# Patient Record
Sex: Female | Born: 1990 | Race: White | Hispanic: No | Marital: Single | Smoking: Former smoker
Health system: Southern US, Community
[De-identification: ages and names within clinical notes are randomized; demographics above are authoritative.]

## PROBLEM LIST (undated history)

## (undated) DIAGNOSIS — J45909 Unspecified asthma, uncomplicated: Secondary | ICD-10-CM

## (undated) DIAGNOSIS — G40909 Epilepsy, unspecified, not intractable, without status epilepticus: Secondary | ICD-10-CM

---
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
## 2011-02-04 ENCOUNTER — Observation Stay: Payer: Self-pay

## 2011-02-04 LAB — URINALYSIS, COMPLETE
Bilirubin,UR: NEGATIVE
Ketone: NEGATIVE
Ph: 6 (ref 4.5–8.0)
Squamous Epithelial: 2

## 2011-03-02 ENCOUNTER — Observation Stay: Payer: Self-pay | Admitting: Obstetrics and Gynecology

## 2011-03-09 ENCOUNTER — Inpatient Hospital Stay: Payer: Self-pay | Admitting: Obstetrics and Gynecology

## 2011-03-09 LAB — CBC WITH DIFFERENTIAL/PLATELET
Basophil %: 0.2 %
Eosinophil #: 0.1 10*3/uL (ref 0.0–0.7)
HCT: 30.6 % — ABNORMAL LOW (ref 35.0–47.0)
HGB: 10.6 g/dL — ABNORMAL LOW (ref 12.0–16.0)
MCH: 32.5 pg (ref 26.0–34.0)
MCHC: 34.6 g/dL (ref 32.0–36.0)
MCV: 94 fL (ref 80–100)
Monocyte #: 0.7 10*3/uL (ref 0.0–0.7)
Neutrophil #: 10.7 10*3/uL — ABNORMAL HIGH (ref 1.4–6.5)
Neutrophil %: 79.6 %
RBC: 3.26 10*6/uL — ABNORMAL LOW (ref 3.80–5.20)

## 2011-03-21 LAB — COMPREHENSIVE METABOLIC PANEL
Alkaline Phosphatase: 119 U/L (ref 82–169)
Anion Gap: 13 (ref 7–16)
Bilirubin,Total: 0.4 mg/dL (ref 0.2–1.0)
Chloride: 106 mmol/L (ref 98–107)
Co2: 22 mmol/L (ref 21–32)
Creatinine: 0.64 mg/dL (ref 0.60–1.30)
EGFR (African American): >60
EGFR (Non-African Amer.): >60
Potassium: 3.4 mmol/L — ABNORMAL LOW (ref 3.5–5.1)
SGPT (ALT): 12 U/L
Sodium: 141 mmol/L (ref 136–145)
Total Protein: 7.8 g/dL (ref 6.4–8.6)

## 2011-03-21 LAB — CBC
MCH: 32.3 pg (ref 26.0–34.0)
MCHC: 34.6 g/dL (ref 32.0–36.0)
MCV: 93 fL (ref 80–100)
Platelet: 352 10*3/uL (ref 150–440)
RDW: 13 % (ref 11.5–14.5)

## 2011-03-21 LAB — LIPASE, BLOOD: Lipase: 74 U/L (ref 73–393)

## 2011-03-22 ENCOUNTER — Inpatient Hospital Stay: Payer: Self-pay | Admitting: Internal Medicine

## 2011-03-22 LAB — URINALYSIS, COMPLETE
Bilirubin,UR: NEGATIVE
Glucose,UR: NEGATIVE mg/dL (ref 0–75)
Ph: 6 (ref 4.5–8.0)
Specific Gravity: 1.018 (ref 1.003–1.030)
Squamous Epithelial: 1
WBC UR: 285 /HPF (ref 0–5)

## 2011-03-22 LAB — DIFFERENTIAL
Bands: 6 %
Basophil #: 0 10*3/uL (ref 0.0–0.1)
Comment - H1-Com2: NORMAL
Lymphocyte #: 1 10*3/uL (ref 1.0–3.6)
Lymphocyte %: 3.8 %
Monocyte #: 2.1 10*3/uL — ABNORMAL HIGH (ref 0.0–0.7)
Monocytes: 7 %
Neutrophil %: 88.2 %

## 2011-03-23 LAB — WBC: WBC: 16.1 10*3/uL — ABNORMAL HIGH (ref 3.6–11.0)

## 2011-03-24 LAB — URINE CULTURE

## 2011-03-27 LAB — CULTURE, BLOOD (SINGLE)

## 2011-11-12 ENCOUNTER — Emergency Department: Payer: Self-pay | Admitting: Emergency Medicine

## 2012-02-10 ENCOUNTER — Emergency Department: Payer: Self-pay | Admitting: Emergency Medicine

## 2012-02-10 LAB — COMPREHENSIVE METABOLIC PANEL
Alkaline Phosphatase: 89 U/L (ref 50–136)
BUN: 10 mg/dL (ref 7–18)
Co2: 23 mmol/L (ref 21–32)
EGFR (African American): >60
EGFR (Non-African Amer.): >60
Glucose: 83 mg/dL (ref 65–99)
Potassium: 3.7 mmol/L (ref 3.5–5.1)
SGOT(AST): 17 U/L (ref 15–37)
Sodium: 141 mmol/L (ref 136–145)

## 2012-02-10 LAB — URINALYSIS, COMPLETE
Bilirubin,UR: NEGATIVE
Glucose,UR: NEGATIVE mg/dL (ref 0–75)
Ketone: NEGATIVE
Ph: 7 (ref 4.5–8.0)
RBC,UR: 4 /HPF (ref 0–5)
Specific Gravity: 1.024 (ref 1.003–1.030)
Squamous Epithelial: 7
WBC UR: 8 /HPF (ref 0–5)

## 2012-02-10 LAB — CBC
HGB: 15 g/dL (ref 12.0–16.0)
Platelet: 231 10*3/uL (ref 150–440)

## 2012-02-11 LAB — TSH: Thyroid Stimulating Horm: 1.38 u[IU]/mL

## 2012-02-11 LAB — CK TOTAL AND CKMB (NOT AT ARMC)
CK, Total: 52 U/L (ref 21–215)
CK-MB: <0.5 ng/mL — ABNORMAL LOW (ref 0.5–3.6)

## 2012-03-21 ENCOUNTER — Emergency Department: Payer: Self-pay | Admitting: Emergency Medicine

## 2012-03-21 LAB — CBC
HCT: 42.4 % (ref 35.0–47.0)
HGB: 14.6 g/dL (ref 12.0–16.0)
Platelet: 226 10*3/uL (ref 150–440)
RDW: 12.9 % (ref 11.5–14.5)
WBC: 12.1 10*3/uL — ABNORMAL HIGH (ref 3.6–11.0)

## 2012-03-21 LAB — URINALYSIS, COMPLETE
Bilirubin,UR: NEGATIVE
Ketone: NEGATIVE
Nitrite: NEGATIVE
Protein: NEGATIVE
Specific Gravity: 1.023 (ref 1.003–1.030)
Squamous Epithelial: 3
WBC UR: 1 /HPF (ref 0–5)

## 2012-03-21 LAB — HCG, QUANTITATIVE, PREGNANCY: Beta Hcg, Quant.: <1 m[IU]/mL — ABNORMAL LOW

## 2012-03-24 ENCOUNTER — Emergency Department: Payer: Self-pay | Admitting: Unknown Physician Specialty

## 2012-03-24 LAB — COMPREHENSIVE METABOLIC PANEL
Anion Gap: 6 — ABNORMAL LOW (ref 7–16)
Calcium, Total: 8.9 mg/dL (ref 8.5–10.1)
Creatinine: 0.59 mg/dL — ABNORMAL LOW (ref 0.60–1.30)
Glucose: 92 mg/dL (ref 65–99)
Osmolality: 273 (ref 275–301)
Potassium: 3.8 mmol/L (ref 3.5–5.1)
SGPT (ALT): 14 U/L (ref 12–78)
Sodium: 136 mmol/L (ref 136–145)
Total Protein: 8.3 g/dL — ABNORMAL HIGH (ref 6.4–8.2)

## 2012-03-24 LAB — URINALYSIS, COMPLETE
Bilirubin,UR: NEGATIVE
Glucose,UR: NEGATIVE mg/dL (ref 0–75)
Nitrite: NEGATIVE
Ph: 5 (ref 4.5–8.0)
Protein: 30
WBC UR: 2 /HPF (ref 0–5)

## 2012-03-24 LAB — CBC
HGB: 14.2 g/dL (ref 12.0–16.0)
MCHC: 33.9 g/dL (ref 32.0–36.0)
MCV: 94 fL (ref 80–100)
RBC: 4.46 10*6/uL (ref 3.80–5.20)
RDW: 13.2 % (ref 11.5–14.5)
WBC: 9.2 10*3/uL (ref 3.6–11.0)

## 2012-11-14 ENCOUNTER — Emergency Department: Payer: Self-pay | Admitting: Emergency Medicine

## 2012-11-14 LAB — COMPREHENSIVE METABOLIC PANEL
Alkaline Phosphatase: 87 U/L (ref 50–136)
Bilirubin,Total: 0.6 mg/dL (ref 0.2–1.0)
Chloride: 107 mmol/L (ref 98–107)
Creatinine: 0.65 mg/dL (ref 0.60–1.30)
Glucose: 104 mg/dL — ABNORMAL HIGH (ref 65–99)
Potassium: 4.5 mmol/L (ref 3.5–5.1)
SGPT (ALT): 16 U/L (ref 12–78)
Sodium: 137 mmol/L (ref 136–145)

## 2012-11-14 LAB — URINALYSIS, COMPLETE
Glucose,UR: NEGATIVE mg/dL (ref 0–75)
Ketone: NEGATIVE
Ph: 5 (ref 4.5–8.0)
Specific Gravity: 1.027 (ref 1.003–1.030)

## 2012-11-14 LAB — CBC
MCV: 94 fL (ref 80–100)
Platelet: 233 10*3/uL (ref 150–440)
RBC: 4.4 10*6/uL (ref 3.80–5.20)
WBC: 9.6 10*3/uL (ref 3.6–11.0)

## 2012-11-14 LAB — LIPASE, BLOOD: Lipase: 154 U/L (ref 73–393)

## 2012-11-24 ENCOUNTER — Ambulatory Visit: Payer: Self-pay | Admitting: Gastroenterology

## 2012-12-12 ENCOUNTER — Ambulatory Visit: Payer: Self-pay | Admitting: Gastroenterology

## 2012-12-14 LAB — PATHOLOGY REPORT

## 2013-01-11 ENCOUNTER — Emergency Department: Payer: Self-pay | Admitting: Emergency Medicine

## 2013-01-11 LAB — RAPID INFLUENZA A&B ANTIGENS

## 2013-03-09 ENCOUNTER — Emergency Department: Payer: Self-pay | Admitting: Emergency Medicine

## 2013-03-09 LAB — CBC
HCT: 42.2 % (ref 35.0–47.0)
HGB: 14.6 g/dL (ref 12.0–16.0)
MCH: 33.1 pg (ref 26.0–34.0)
MCHC: 34.6 g/dL (ref 32.0–36.0)
MCV: 96 fL (ref 80–100)
Platelet: 251 10*3/uL (ref 150–440)
RBC: 4.42 10*6/uL (ref 3.80–5.20)
RDW: 13.1 % (ref 11.5–14.5)
WBC: 8 10*3/uL (ref 3.6–11.0)

## 2013-03-09 LAB — URINALYSIS, COMPLETE
BILIRUBIN, UR: NEGATIVE
Bacteria: NONE SEEN
GLUCOSE, UR: NEGATIVE mg/dL (ref 0–75)
Hyaline Cast: 3
Leukocyte Esterase: NEGATIVE
NITRITE: NEGATIVE
Ph: 5 (ref 4.5–8.0)
RBC,UR: 5 /HPF (ref 0–5)
Specific Gravity: 1.018 (ref 1.003–1.030)
Squamous Epithelial: 5

## 2013-03-09 LAB — COMPREHENSIVE METABOLIC PANEL
ALT: 15 U/L (ref 12–78)
AST: 27 U/L (ref 15–37)
Albumin: 4 g/dL (ref 3.4–5.0)
Alkaline Phosphatase: 84 U/L
Anion Gap: 12 (ref 7–16)
BUN: 8 mg/dL (ref 7–18)
Bilirubin,Total: 0.9 mg/dL (ref 0.2–1.0)
CALCIUM: 9.3 mg/dL (ref 8.5–10.1)
CO2: 20 mmol/L — AB (ref 21–32)
Chloride: 106 mmol/L (ref 98–107)
Creatinine: 0.56 mg/dL — ABNORMAL LOW (ref 0.60–1.30)
EGFR (Non-African Amer.): >60
GLUCOSE: 86 mg/dL (ref 65–99)
Osmolality: 273 (ref 275–301)
Potassium: 4.3 mmol/L (ref 3.5–5.1)
SODIUM: 138 mmol/L (ref 136–145)
Total Protein: 8 g/dL (ref 6.4–8.2)

## 2013-03-09 LAB — DRUG SCREEN, URINE
AMPHETAMINES, UR SCREEN: NEGATIVE (ref ?–1000)
Barbiturates, Ur Screen: NEGATIVE (ref ?–200)
Benzodiazepine, Ur Scrn: NEGATIVE (ref ?–200)
Cannabinoid 50 Ng, Ur ~~LOC~~: NEGATIVE (ref ?–50)
Cocaine Metabolite,Ur ~~LOC~~: NEGATIVE (ref ?–300)
MDMA (ECSTASY) UR SCREEN: NEGATIVE (ref ?–500)
Methadone, Ur Screen: NEGATIVE (ref ?–300)
OPIATE, UR SCREEN: NEGATIVE (ref ?–300)
Phencyclidine (PCP) Ur S: NEGATIVE (ref ?–25)
TRICYCLIC, UR SCREEN: NEGATIVE (ref ?–1000)

## 2013-03-09 LAB — ETHANOL

## 2013-03-15 IMAGING — CR DG CHEST 2V
1 series · 2 of 2 positions shown · non-contrast
Comparison: none

REASON FOR EXAM: palpitations
COMMENTS:

[Series 1: w chest pa · 0.14mm/px · 2 of 2 slices shown]
[im 1/2]
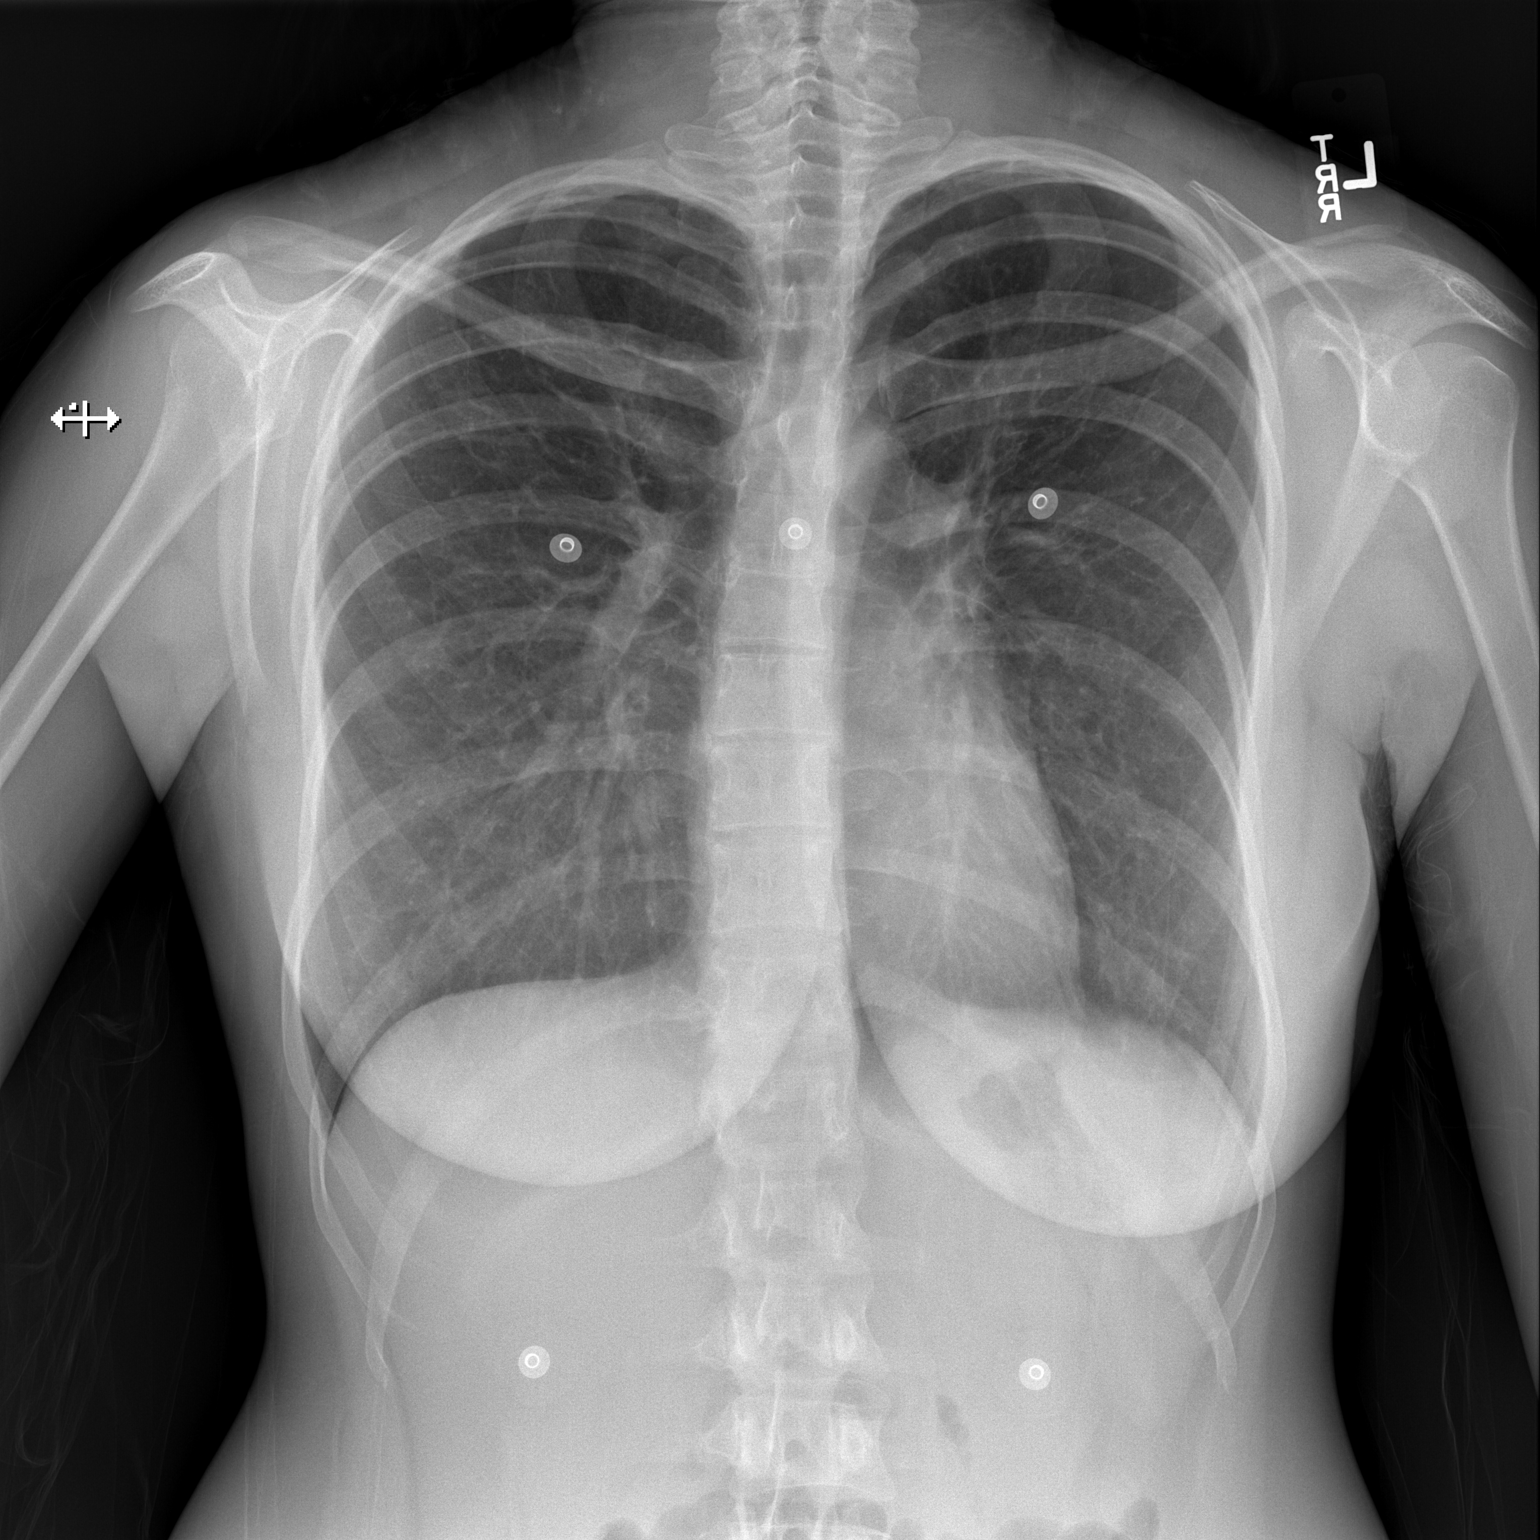
[im 2/2]
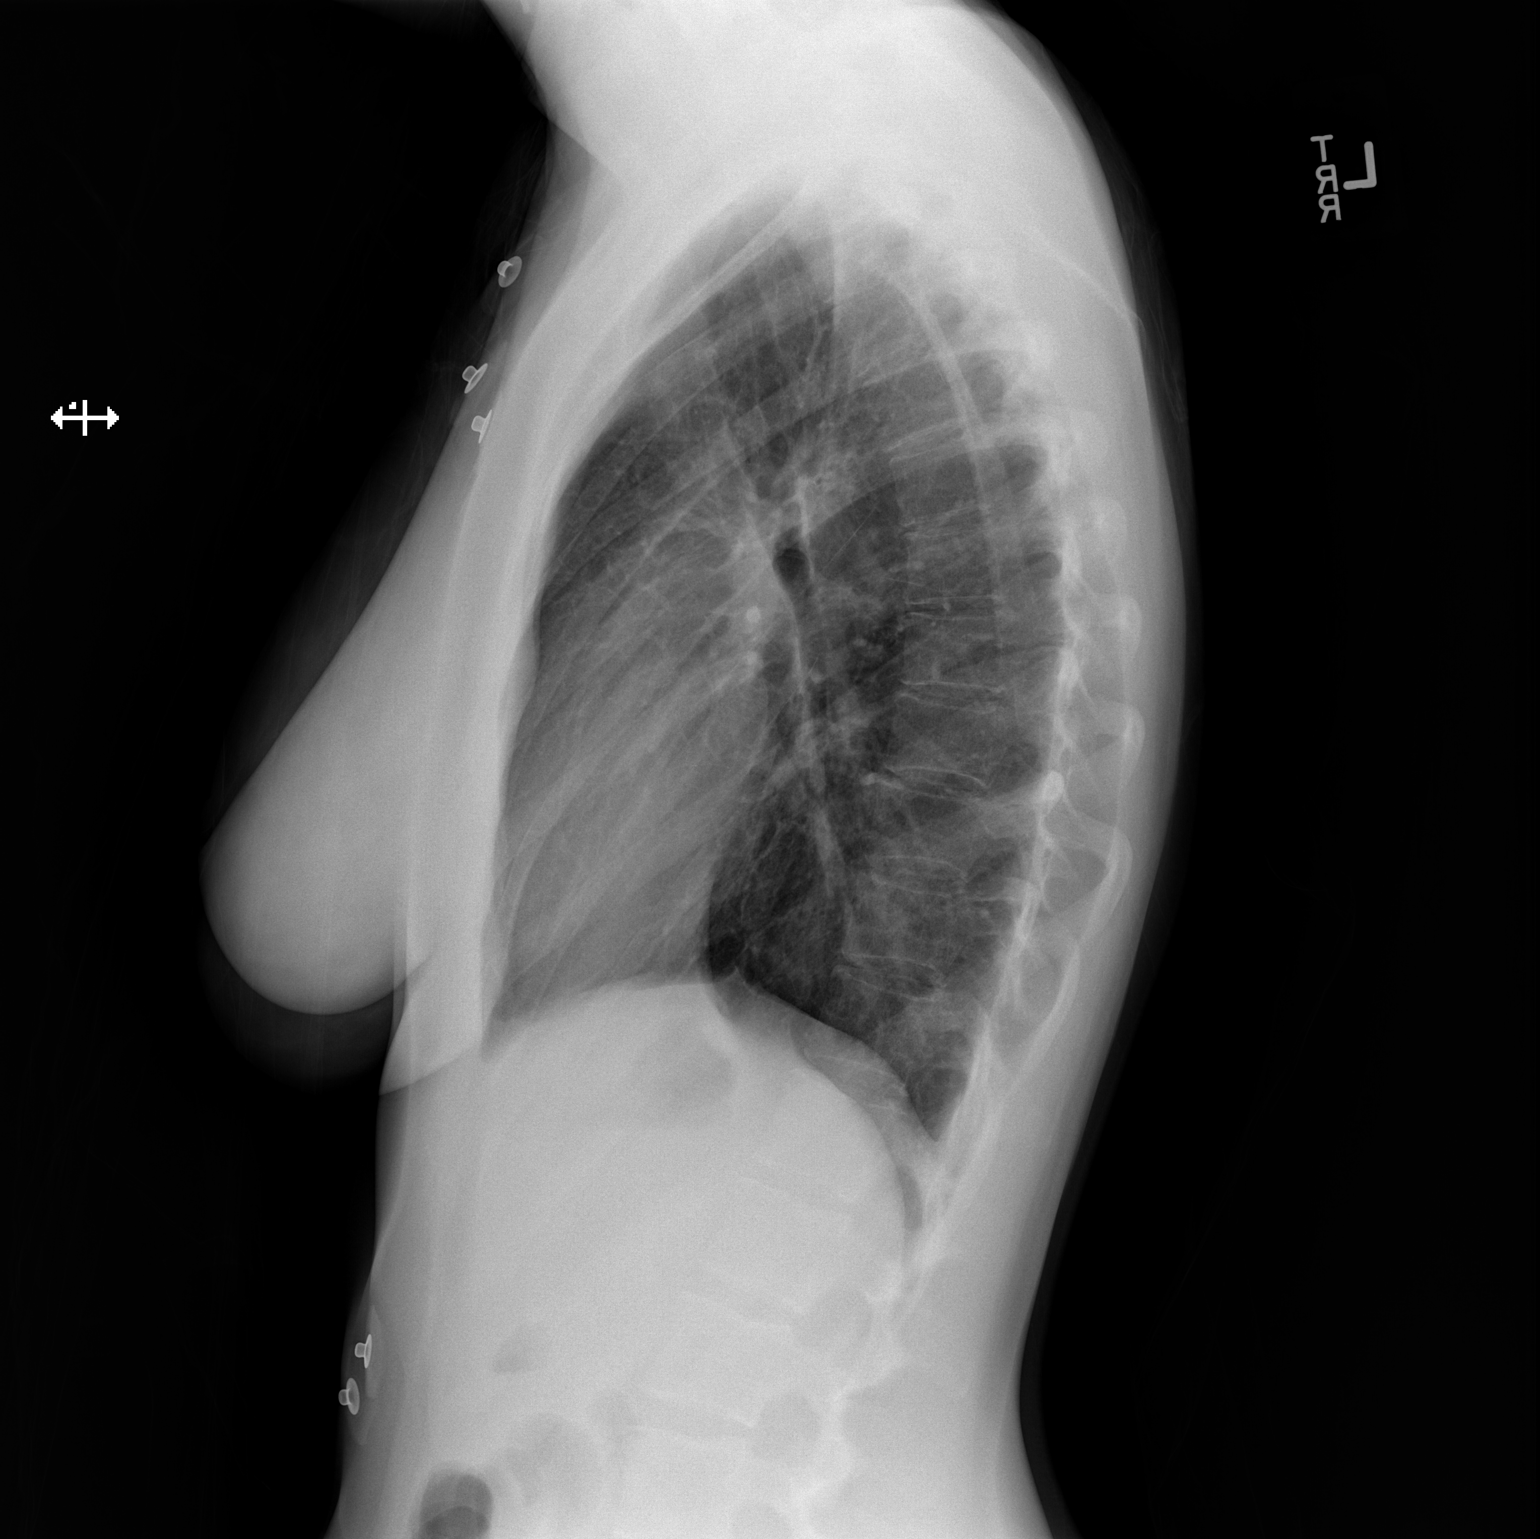

[2 of 2 positions shown; findings below may reference images not displayed]

PROCEDURE:     DXR - DXR CHEST PA (OR AP) AND LATERAL  - February 11, 2012 [DATE]

RESULT:     Comparison is made to the study June 18, 2009.

The lungs are mildly hyperinflated. There is no focal infiltrate. The
perihilar lung markings are coarse but are not entirely new. The cardiac
silhouette is normal in size. The pulmonary vascularity is not engorged.
There is no pleural effusion.
IMPRESSION: 1. There is mild hyperinflation which may be voluntary or could reflect an
element of underlying reactive airway disease. I cannot exclude superimposed
acute bronchitis with minimal perihilar subsegmental atelectasis. There is
no focal pneumonia.
2. There is no overt evidence of CHF. There is no pleural effusion.

[REDACTED]

## 2013-04-16 DIAGNOSIS — J45909 Unspecified asthma, uncomplicated: Secondary | ICD-10-CM | POA: Insufficient documentation

## 2013-11-01 ENCOUNTER — Emergency Department: Payer: Self-pay | Admitting: Emergency Medicine

## 2013-11-02 ENCOUNTER — Emergency Department: Payer: Self-pay | Admitting: Emergency Medicine

## 2013-11-02 LAB — URINALYSIS, COMPLETE
Bacteria: NONE SEEN
Bilirubin,UR: NEGATIVE
Glucose,UR: NEGATIVE mg/dL (ref 0–75)
Leukocyte Esterase: NEGATIVE
Nitrite: NEGATIVE
PH: 5 (ref 4.5–8.0)
Protein: 30
RBC,UR: 10 /HPF (ref 0–5)
Specific Gravity: 1.029 (ref 1.003–1.030)
Squamous Epithelial: 9

## 2013-11-02 LAB — WET PREP, GENITAL

## 2013-11-02 LAB — GC/CHLAMYDIA PROBE AMP

## 2013-12-07 ENCOUNTER — Emergency Department: Payer: Self-pay | Admitting: Emergency Medicine

## 2013-12-07 LAB — CBC WITH DIFFERENTIAL/PLATELET
Basophil #: 0.1 10*3/uL (ref 0.0–0.1)
Basophil %: 0.7 %
EOS ABS: 0.4 10*3/uL (ref 0.0–0.7)
EOS PCT: 5.1 %
HCT: 42.4 % (ref 35.0–47.0)
HGB: 14.3 g/dL (ref 12.0–16.0)
LYMPHS PCT: 27 %
Lymphocyte #: 2 10*3/uL (ref 1.0–3.6)
MCH: 32.8 pg (ref 26.0–34.0)
MCHC: 33.6 g/dL (ref 32.0–36.0)
MCV: 98 fL (ref 80–100)
MONOS PCT: 6.1 %
Monocyte #: 0.5 x10 3/mm (ref 0.2–0.9)
NEUTROS PCT: 61.1 %
Neutrophil #: 4.6 10*3/uL (ref 1.4–6.5)
Platelet: 197 10*3/uL (ref 150–440)
RBC: 4.34 10*6/uL (ref 3.80–5.20)
RDW: 12.6 % (ref 11.5–14.5)
WBC: 7.5 10*3/uL (ref 3.6–11.0)

## 2013-12-07 LAB — URINALYSIS, COMPLETE
BACTERIA: NONE SEEN
Bilirubin,UR: NEGATIVE
Blood: NEGATIVE
GLUCOSE, UR: NEGATIVE mg/dL (ref 0–75)
KETONE: NEGATIVE
Leukocyte Esterase: NEGATIVE
Nitrite: NEGATIVE
Ph: 7 (ref 4.5–8.0)
Protein: 30
RBC,UR: 7 /HPF (ref 0–5)
SPECIFIC GRAVITY: 1.026 (ref 1.003–1.030)

## 2013-12-07 LAB — COMPREHENSIVE METABOLIC PANEL
ALBUMIN: 3.7 g/dL (ref 3.4–5.0)
ALT: 16 U/L
ANION GAP: 6 — AB (ref 7–16)
AST: 10 U/L — AB (ref 15–37)
Alkaline Phosphatase: 65 U/L
BUN: 9 mg/dL (ref 7–18)
Bilirubin,Total: 0.4 mg/dL (ref 0.2–1.0)
CHLORIDE: 109 mmol/L — AB (ref 98–107)
CREATININE: 0.64 mg/dL (ref 0.60–1.30)
Calcium, Total: 8.7 mg/dL (ref 8.5–10.1)
Co2: 24 mmol/L (ref 21–32)
EGFR (African American): >60
Glucose: 103 mg/dL — ABNORMAL HIGH (ref 65–99)
Osmolality: 276 (ref 275–301)
Potassium: 3.5 mmol/L (ref 3.5–5.1)
Sodium: 139 mmol/L (ref 136–145)
Total Protein: 7.2 g/dL (ref 6.4–8.2)

## 2013-12-07 LAB — HCG, QUANTITATIVE, PREGNANCY: Beta Hcg, Quant.: <1 m[IU]/mL — ABNORMAL LOW

## 2014-04-18 DIAGNOSIS — G40309 Generalized idiopathic epilepsy and epileptic syndromes, not intractable, without status epilepticus: Secondary | ICD-10-CM | POA: Insufficient documentation

## 2014-04-22 ENCOUNTER — Emergency Department: Admit: 2014-04-22 | Disposition: A | Discharge status: Home / Self Care | Payer: Self-pay | Admitting: Student

## 2014-04-22 LAB — HCG, QUANTITATIVE, PREGNANCY: Beta Hcg, Quant.: 134908 m[IU]/mL — ABNORMAL HIGH

## 2014-04-22 LAB — CBC
HCT: 37.3 % (ref 35.0–47.0)
HGB: 13 g/dL (ref 12.0–16.0)
MCH: 33.4 pg (ref 26.0–34.0)
MCHC: 34.8 g/dL (ref 32.0–36.0)
MCV: 96 fL (ref 80–100)
Platelet: 177 10*3/uL (ref 150–440)
RBC: 3.88 10*6/uL (ref 3.80–5.20)
RDW: 12.4 % (ref 11.5–14.5)
WBC: 10.4 10*3/uL (ref 3.6–11.0)

## 2014-05-05 NOTE — H&P (Signed)
PATIENT NAME:  Melissa Dominguez, Melissa Dominguez MR#:  [id number] DATE OF BIRTH:  03-Mar-1990  DATE OF ADMISSION:  03/22/2011  PRIMARY CARE PHYSICIAN: [facility]    CHIEF COMPLAINT: Back pain, cold sweats, chills x1 week.   HISTORY OF PRESENT ILLNESS: Melissa Dominguez is a 24 year old pleasant Caucasian female who delivered her baby girl about 10 days ago. At that time she had epidural analgesia. The patient reported that over the last one week since Monday she has escalating back pain and she thought it was related to her epidural. She was taking ibuprofen and hydrocodone for the pain. She also noticed cold sweats and chills throughout the last one week and felt feverish but she did not check her temperature. Her back pain escalated and she reported vomiting a couple of days ago and then yesterday she had another episode of vomiting and there was some streaks of blood with that and that was the reason for her to come to seek medical advice. Evaluation here in the Emergency Department with CAT scan reveals right kidney lesion consistent with acute pyelonephritis. However, concern was raised that this may develop or progress into an abscess. There was also nonobstructing right-sided kidney stone. The patient was admitted for further evaluation and management.   REVIEW OF SYSTEMS: CONSTITUTIONAL: She is unsure about fever. Reports sweating and chills for the last one week. EYES: Denies any blurring of vision. No double vision. ENT: No hearing impairment. No sore throat. No dysphagia. CARDIOVASCULAR: No chest pain. No shortness of breath. No edema. No syncope. RESPIRATORY: No sputum production. No cough. No chest pain or shortness of breath. GASTROINTESTINAL: No abdominal pain. Reports a couple of episodes of vomiting with little streaks of blood. No diarrhea. GENITOURINARY: No dysuria or frequency of urination. MUSCULOSKELETAL: No joint pain or swelling. No muscular pain or swelling. INTEGUMENTARY: No skin rash. No ulcers.  NEUROLOGY: No focal weakness. No seizure activity. No headache. PSYCHIATRY: No anxiety. No depression. ENDOCRINE: Denies any heat or cold intolerance. She admits having sweating.   PAST MEDICAL HISTORY:  1. History of asthma. 2. Iron deficiency anemia.  3. History of kyphosis.   SOCIAL HISTORY: She is single, lives with her boyfriend. She is unemployed.   FAMILY HISTORY: Her father was born with one functioning kidney. He also has history of kidney stones.   SOCIAL HABITS: Chronic smoker. She smokes lightly, just 3 to 4 cigarettes a day for the last five years. No history of alcohol or drug abuse.   ADMISSION MEDICATIONS:  1. Ibuprofen p.r.n.  2. Norco 5/325 p.r.n. for pain. 3. Albuterol inhaler p.r.n.  4. Multivitamin once a day.  5. Ferrous sulfate 325 mg once a day.   ALLERGIES: No known drug allergies.   PHYSICAL EXAMINATION:   VITAL SIGNS: Blood pressure 104/63, respiratory rate 20, pulse 91, temperature 99, oxygen saturation 98%.   GENERAL APPEARANCE: Young female laying in bed in no acute distress.   HEAD: No pallor. No icterus. No cyanosis.   EARS, NOSE, AND THROAT: Hearing was normal. Nasal mucosa, lips, tongue were normal.   EYES: Normal iris and conjunctivae. Pupils about 5 mm, equal and reactive to light.   NECK: Supple. Trachea at midline. No thyromegaly. No cervical lymphadenopathy. No masses.   HEART: Normal S1, S2. No S3, S4. No murmur. No gallop. No carotid bruits.   RESPIRATORY: Normal breathing pattern without use of accessory muscles. No rales. No wheezing.   ABDOMEN: Soft without tenderness. No hepatosplenomegaly. No masses. No hernias.  SKIN: No ulcers. No subcutaneous nodules.   MUSCULOSKELETAL: No joint swelling. No clubbing.  NEUROLOGIC: Cranial nerves II through XII are intact. No focal motor deficits.   PSYCHIATRY: The patient is alert and oriented x3. Mood and affect were normal.   LABORATORY, DIAGNOSTIC, AND RADIOLOGICAL DATA: CAT scan  of the abdomen showed heterogeneous 2.4 cm lesion involving the midpole of the right kidney. There is associated inflammatory stranding. There is a small amount of edema surrounding the right kidney. There is nonobstructing right renal stone. Question was raised about concern that this may develop into an abscess and needs follow-up.   Serum glucose 85, BUN 10, creatinine 0.6, sodium 141, potassium 3.4. Liver function tests were normal except for mildly reduced albumin at 3. CBC showed white count of 26,000, hemoglobin 10, hematocrit 31, platelet count 352. MCV, MCH, and MCHC were normal. Urinalysis showed turbid colored urine, 100 mg of protein, nitrites negative, leukocyte esterase +3, 285 white blood cells, 45 red blood cells, +3 bacteria.   ASSESSMENT:  1. Acute pyelonephritis with evidence of right kidney inflammatory lesion. Concern was raised whether this will progress into an abscess and needs a follow-up.  2. Significant leukocytosis with white count reaching 26,000 secondary to acute pyelonephritis.   3. Normocytic, normochromic anemia.  4. Mild hypokalemia.  5. The patient is postpartum. She delivered a baby girl about 10 days ago.  6. History of asthma.  7. History of kyphosis.  8. Tobacco abuse.   PLAN:  1. The patient was admitted to the medical floor and started on IV antibiotic using Zosyn.  2. Blood cultures were taken and urine culture as well.  3. I will obtain a Urology consultation to evaluate the right kidney finding and also to establish follow-up whether inpatient or outpatient.  4. Potassium supplementation to correct the hypokalemia.  5. Pain control with hydrocodone or morphine depending on the magnitude of the pain. 6. IV hydration with normal saline.  7. The patient needs to quit tobacco or smoking. I offered nicotine patch but she felt it is not necessary.  8. For deep vein thrombosis prophylaxis, I placed her on TED stockings and I encouraged early ambulation.   9. I also placed her on Zantac 150 mg twice a day since she has some GI problems.  10. Zofran p.r.n. for vomiting but right now she is not vomiting.   TIME NEEDED TO EVALUATE THIS PATIENT: More than 50 minutes.   ____________________________ Carney CornersAmir M. Rudene Rearwish, MD amd:drc D: 03/22/2011 05:49:32 ET T: 03/22/2011 07:52:51 ET JOB#: [id number]  cc: Carney CornersAmir M. Rudene Rearwish, MD, <Dictator> [facility] Zollie ScaleAMIR M Caidence Higashi MD ELECTRONICALLY SIGNED 03/22/2011 22:34

## 2014-05-05 NOTE — Discharge Summary (Signed)
PATIENT NAME:  Melissa Dominguez, Bristal D MR#:  [id number] DATE OF BIRTH:  31-Jan-1990  DATE OF ADMISSION:  03/22/2011 DATE OF DISCHARGE:  03/23/2011  PRESENTING COMPLAINT: Abdominal pain, fever.   DISCHARGE DIAGNOSES:  1. Right-sided acute pyelonephritis.  2. Leukocytosis, improved.  3. Right flank pain.  4. Patient is postpartum two weeks.     MEDICATIONS:  1. Prenatal vitamins p.o. daily.  2. Albuterol 2 puffs once a day as needed.  3. Ferrous sulfate 325 mg p.o. daily.  4. Keflex 500 mg p.o. t.i.d. for eight days.  5. Tramadol 50 mg 3 times daily p.r.n.  6. Tylenol 500 mg t.i.d. p.r.n. for fever.   DIET: Regular.   FOLLOWUP:  1. Follow up with Dr. Lonna CobbStoioff on 05/14 at 2:30 p.m.  2. [facility] 03/29 at  9:00 a.m.  3. Follow up with renal ultrasound appointment Wednesday 04/10 at 9:30 a.m.   CONDITION ON DISCHARGE: Fair. Vitals stable. Blood pressure 110/69, temperature is 98.6.   LABORATORY AND RADIOLOGICAL DATA: White count 16.1. Blood cultures: No growth in 18 to 24 hours. Urine positive for urinary tract infection. Urine culture is more than 100,000 gram-negative rods. Hemoglobin and hematocrit are 10.7 and 31. Platelet count is 352. Comprehensive metabolic panel is within normal limits, except albumin 3 and potassium of 3.4. Lipase 74.   CT of the abdomen shows abnormal appearance of the right kidney consistent with pyelonephritis and possible focal renal abscess in the mid pole laterally. There is a nonobstructing lower pole stone on the right as well. Uterus remains enlarged. No acute hepatobiliary abnormality.   BRIEF SUMMARY OF HOSPITAL COURSE: Melissa Dominguez is a 24 year old Caucasian female who was about two weeks postpartum and came to the Emergency Room after she started having nausea, vomiting, right flank pain, and high-grade fever. She was found to have acute pyelonephritis. She was admitted with:  1. Systemic inflammatory response syndrome secondary to  acute pyelonephritis with urinary tract infection. She was started on IV Zosyn, given Tylenol, IV pain medicine, and antiemetics.  She was able to tolerate p.o. regular diet. Her urine culture has gram-negative rods, culture and sensitivity pending. The patient was very anxious to go home given her newborn at home and requested she be discharged. She will follow up with Dr. Lonna CobbStoioff and [facility] as [facility]. She will be discharged home on Keflex 500 mg p.o. t.i.d. for eight more days. The patient told me in the presence of her family members that she is likely not going to be breast- feeding. Her CT of abdomen showed lobar nephronia along with right-sided pyelonephritis. If her symptoms do not improve she is recommended to follow up with [facility] or call Dr. Heywood FootmanStoioff's office.  2. Leukocytosis. White count on admission was 26,000, prior to discharge 16,000. The patient was afebrile.  3. Right flank pain due to pyelonephritis.  4. Tobacco abuse. She was advised on smoking cessation.  5. Postpartum approximately two weeks.   Hospital stay otherwise remained stable. Discharge plan was discussed with the patient and the patient's boyfriend. She remained a FULL CODE.    TIME SPENT:  40 minutes. ____________________________ Wylie HailSona A. Allena KatzPatel, MD sap:bjt D: 03/23/2011 15:08:49 ET T: 03/24/2011 13:58:59 ET JOB#: [id number]  cc: [facility] Scott C. Lonna CobbStoioff, MD Willow OraSONA A Nicolaos Mitrano MD ELECTRONICALLY SIGNED 03/25/2011 16:05

## 2014-05-05 NOTE — Consult Note (Signed)
Brief Consult Note: Diagnosis: pyelonephritis.   Patient was seen by consultant.   Consult note dictated.  Electronic Signatures: Riki AltesStoioff, Myshawn Chiriboga C (MD)  (Signed 11-Mar-13 18:23)  Authored: Brief Consult Note   Last Updated: 11-Mar-13 18:23 by Riki AltesStoioff, Icarus Partch C (MD)

## 2014-05-05 NOTE — Consult Note (Signed)
PATIENT NAME:  Melissa Dominguez, Melissa Dominguez MR#:  [id number] DATE OF BIRTH:  May 15, 1990  DATE OF CONSULTATION:  03/22/2011  REFERRING PHYSICIAN:  Dr. Allena KatzPatel  CONSULTING PHYSICIAN:  Scott C. Lonna CobbStoioff, MD  REASON FOR CONSULTATION: Pyelonephritis.   HISTORY OF PRESENT ILLNESS: 24 year old white female admitted with a one-week history of progressively worsening right flank pain which over the past several days was also associated with nausea, vomiting, fever and chills. She was seen in the Emergency Department where urinalysis shows pyuria. WBC was 22,000. CT showed an abnormal appearing right kidney felt consistent with pyelonephritis. Possible early abscess formation. She was started on IV Zosyn and feeling significantly better since her admission. She has been febrile since admission. She denies prior history of urologic problems.   PAST MEDICAL HISTORY: Asthma.   PAST SURGICAL HISTORY: None.   MEDICATIONS ON ADMISSION:  1. Ibuprofen p.r.n.  2. Norco 5/325 as needed for  pain.  3. Albuterol inhaler as needed.  4. Multivitamins daily.  5. Ferrous sulfate 325 mg daily.   ALLERGIES: No known drug allergies.   SOCIAL HISTORY: She smokes 3 or 4 cigarettes per day. No alcohol use.   REVIEW OF SYSTEMS: She is 10 days postpartum. Otherwise noncontributory except as per the history of present illness.   PHYSICAL EXAMINATION:  VITAL SIGNS: Temperature 98.1, pulse 97, blood pressure 97/63.   GENERAL: Patient is in no acute distress.   LABORATORY, DIAGNOSTIC AND RADIOLOGICAL DATA: WBC 26.3, creatinine 0.64. Urinalysis 285 WBCs, 45 RBCs. CT of the abdomen and pelvis reviewed. The right kidney is slightly enlarged. There is a nonobstructing 5 mm right lower pole stone. There is a hypodensity present in the lateral aspect of the right kidney.   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   RECOMMENDATION: She appears to be responding clinically. If she continues to  respond would recommend a follow up CT in 4 to 6 weeks. I will follow.  ____________________________ Verna CzechScott C. Lonna CobbStoioff, MD scs:cms Dominguez: 03/22/2011 18:23:02 ET T: 03/23/2011 10:48:07 ET JOB#: [id number]  cc: Lorin PicketScott C. Lonna CobbStoioff, MD, <Dictator> Riki AltesSCOTT C STOIOFF MD ELECTRONICALLY SIGNED 03/24/2011 9:22

## 2014-05-09 ENCOUNTER — Emergency Department
Admit: 2014-05-09 | Disposition: A | Discharge status: Home / Self Care | Payer: Self-pay | Admitting: Emergency Medicine

## 2014-05-09 LAB — CBC WITH DIFFERENTIAL/PLATELET
BASOS ABS: 0 10*3/uL (ref 0.0–0.1)
Basophil %: 0.4 %
EOS ABS: 0.2 10*3/uL (ref 0.0–0.7)
Eosinophil %: 1.6 %
HCT: 35.7 % (ref 35.0–47.0)
HGB: 12.5 g/dL (ref 12.0–16.0)
Lymphocyte #: 1.9 10*3/uL (ref 1.0–3.6)
Lymphocyte %: 17.6 %
MCH: 33.4 pg (ref 26.0–34.0)
MCHC: 34.9 g/dL (ref 32.0–36.0)
MCV: 96 fL (ref 80–100)
MONOS PCT: 5 %
Monocyte #: 0.5 x10 3/mm (ref 0.2–0.9)
NEUTROS ABS: 8.1 10*3/uL — AB (ref 1.4–6.5)
Neutrophil %: 75.4 %
PLATELETS: 202 10*3/uL (ref 150–440)
RBC: 3.73 10*6/uL — ABNORMAL LOW (ref 3.80–5.20)
RDW: 12.4 % (ref 11.5–14.5)
WBC: 10.7 10*3/uL (ref 3.6–11.0)

## 2014-05-09 LAB — COMPREHENSIVE METABOLIC PANEL
ANION GAP: 10 (ref 7–16)
AST: 17 U/L
Albumin: 3.2 g/dL — ABNORMAL LOW
Alkaline Phosphatase: 44 U/L
BUN: 8 mg/dL
Bilirubin,Total: 0.4 mg/dL
CALCIUM: 8.7 mg/dL — AB
CO2: 21 mmol/L — AB
Chloride: 106 mmol/L
Creatinine: 0.44 mg/dL
Glucose: 85 mg/dL
Potassium: 3.4 mmol/L — ABNORMAL LOW
SGPT (ALT): 10 U/L — ABNORMAL LOW
Sodium: 137 mmol/L
Total Protein: 6.2 g/dL — ABNORMAL LOW

## 2014-08-15 ENCOUNTER — Encounter: Payer: Self-pay | Admitting: Emergency Medicine

## 2014-08-15 ENCOUNTER — Emergency Department
Admission: EM | Admit: 2014-08-15 | Discharge: 2014-08-15 | Disposition: A | Discharge status: Home / Self Care | Payer: Medicaid Other | Attending: Emergency Medicine | Admitting: Emergency Medicine

## 2014-08-15 DIAGNOSIS — Z3A29 29 weeks gestation of pregnancy: Secondary | ICD-10-CM | POA: Diagnosis not present

## 2014-08-15 DIAGNOSIS — Z3483 Encounter for supervision of other normal pregnancy, third trimester: Secondary | ICD-10-CM | POA: Insufficient documentation

## 2014-08-15 DIAGNOSIS — R002 Palpitations: Secondary | ICD-10-CM | POA: Insufficient documentation

## 2014-08-15 DIAGNOSIS — Z0000 Encounter for general adult medical examination without abnormal findings: Secondary | ICD-10-CM

## 2014-08-15 DIAGNOSIS — Z3490 Encounter for supervision of normal pregnancy, unspecified, unspecified trimester: Secondary | ICD-10-CM

## 2014-08-15 DIAGNOSIS — O9989 Other specified diseases and conditions complicating pregnancy, childbirth and the puerperium: Secondary | ICD-10-CM | POA: Diagnosis present

## 2014-08-15 HISTORY — DX: Epilepsy, unspecified, not intractable, without status epilepticus: G40.909

## 2014-08-15 LAB — CBC
HCT: 29.2 % — ABNORMAL LOW (ref 35.0–47.0)
HEMOGLOBIN: 10.4 g/dL — AB (ref 12.0–16.0)
MCH: 34.3 pg — AB (ref 26.0–34.0)
MCHC: 35.7 g/dL (ref 32.0–36.0)
MCV: 96.2 fL (ref 80.0–100.0)
PLATELETS: 198 10*3/uL (ref 150–440)
RBC: 3.04 MIL/uL — ABNORMAL LOW (ref 3.80–5.20)
RDW: 12.5 % (ref 11.5–14.5)
WBC: 12.6 10*3/uL — ABNORMAL HIGH (ref 3.6–11.0)

## 2014-08-15 LAB — COMPREHENSIVE METABOLIC PANEL
ALT: 10 U/L — ABNORMAL LOW (ref 14–54)
AST: 19 U/L (ref 15–41)
Albumin: 2.8 g/dL — ABNORMAL LOW (ref 3.5–5.0)
Alkaline Phosphatase: 77 U/L (ref 38–126)
Anion gap: 6 (ref 5–15)
BUN: 5 mg/dL — AB (ref 6–20)
CALCIUM: 8.2 mg/dL — AB (ref 8.9–10.3)
CHLORIDE: 108 mmol/L (ref 101–111)
CO2: 22 mmol/L (ref 22–32)
Creatinine, Ser: 0.31 mg/dL — ABNORMAL LOW (ref 0.44–1.00)
GFR calc Af Amer: >60 mL/min (ref >60)
GFR calc non Af Amer: >60 mL/min (ref >60)
Glucose, Bld: 104 mg/dL — ABNORMAL HIGH (ref 65–99)
POTASSIUM: 3 mmol/L — AB (ref 3.5–5.1)
Sodium: 136 mmol/L (ref 135–145)
Total Bilirubin: 0.1 mg/dL — ABNORMAL LOW (ref 0.3–1.2)
Total Protein: 6 g/dL — ABNORMAL LOW (ref 6.5–8.1)

## 2014-08-15 LAB — URINALYSIS COMPLETE WITH MICROSCOPIC (ARMC ONLY)
BACTERIA UA: NONE SEEN
BILIRUBIN URINE: NEGATIVE
Glucose, UA: NEGATIVE mg/dL
KETONES UR: NEGATIVE mg/dL
Nitrite: NEGATIVE
PROTEIN: NEGATIVE mg/dL
Specific Gravity, Urine: 1.01 (ref 1.005–1.030)
pH: 7 (ref 5.0–8.0)

## 2014-08-15 NOTE — ED Notes (Addendum)
Patient presents to the ED with c/o palpitations and have a seizure like aura that began earlier tonight. PMH significant for Epilepsy. Of note, patient is a 29 week G2P1. Patient takes Keppra  PO BID - last dose at 2230 - denies missing doses.

## 2014-08-15 NOTE — Discharge Instructions (Signed)
Pregnancy and Epilepsy °Epilepsy is a lifelong (chronic) disease that causes repeated seizures. A brain dysfunction makes nerve cells produce too many electrical discharges, causing the seizures. These discharges are unpredictable and can make the body shake uncontrollably.  °There is no way to tell whether pregnancy will make your epilepsy worse. With the right care, however, chances are good you will have a normal, healthy baby. If you are planning to get pregnant, work with your health care provider and go to all your prenatal appointments.  °WHAT ARE THE RISKS? °Most women with epilepsy have safe pregnancies and healthy babies, but having the condition does mean there are some additional risks. These include: °· Uncontrolled nausea and vomiting that does not go away (hyperemesis gravidarum). °· Vaginal bleeding. °· Anemia. °· Endocrine hormone disorders. °· Premature labor. °· Failure to progress in labor. °· Increased chance of cesarean delivery. °· A slightly increased risk for birth defects.    °WHAT IF I HAVE A SEIZURE WHILE I AM PREGNANT? °A seizure during pregnancy can reduce blood supply to your baby and may increase your risk for early labor. You will likely have fewer or the same number of seizures during pregnancy. However, there is a small chance that you will have more seizures. This may be from hormonal changes, stress, or a lower dose of seizure medicine.  °To prevent seizures, your health care provider will do frequent blood tests to make sure your antiepileptic drug is at a safe level. °WHAT ARE THE TREATMENT OPTIONS FOR EPILEPSY WHILE I AM PREGNANT? °Your health care provider can answer questions about your epilepsy treatment if you are pregnant or planning a pregnancy. °· Follow your health care provider's treatment advice. °· Take your antiepileptic drug as directed by your health care provider. °· You may have to take higher-than-normal doses of folic acid. This is to help protect your baby  from spinal cord defects. °· Keep all your appointments for prenatal care and checkups for epilepsy. °WILL MY MEDICINE AFFECT MY BABY? °To protect your baby, your health care provider will prescribe the safest antiepileptic drug at the lowest dose that will still prevent seizures. Your baby will also be checked often to make sure everything is okay.  °Antiepileptic drugs are used during pregnancy because the risk of a seizure harming your baby is higher than the risk of the medicine harming your baby. If you have not had any seizures for several years, you may not need to take an antiepileptic drug.  °WILL I BE ABLE TO BREASTFEED MY BABY? °Women with epilepsy are encouraged to breastfeed. Your antiepileptic drug may pass through your breast milk in small amounts, but it is usually not enough to affect your baby. Take your antiepileptic drug after you breastfeed your baby, not before. °WHEN SHOULD I SEEK MEDICAL CARE?  °If you are pregnant and have epilepsy, you should contact your health care provider if: °· You think you are having minor or isolated seizures. °· You do not feel the baby moving as much as usual. °· You develop tiredness or weakness, or you feel faint.   °WHEN SHOULD I SEEK IMMEDIATE MEDICAL CARE? °You should seek emergency care right away if: °· You have very bad abdominal pain. °· You have vaginal bleeding, with or without abdominal pain. °· You do not feel the baby moving. °· You have very bad headaches. °· You have vision problems. °· You have numbness on your body. °· You cannot stop vomiting. °Document Released: 12/26/1999 Document Revised: 05/14/2013 Document Reviewed: 12/01/2012 °  [employer]® Patient Information ©2015 [employer]. This information is not intended to replace advice given to you by your health care provider. Make sure you discuss any questions you have with your health care provider. ° °

## 2014-08-15 NOTE — ED Notes (Addendum)
Patient presents to the ED with c/o palpitations and have a seizure like aura that began earlier tonight pt not having any symptoms at this time. Noted  Hx of  Epilepsy for the last year.  Patient is a 29 week G2P1. Patient takes Keppra  PO BID - last dose at 2230 - denies missing doses.

## 2014-08-15 NOTE — ED Provider Notes (Signed)
[facility] Emergency Department Provider Note  Time seen: 12:47 AM  I have reviewed the triage vital signs and the nursing notes.   HISTORY  Chief Complaint Palpitations and Seizure aura     HPI Melissa Dominguez is a 24 y.o. female with a past medical history of epilepsy who presents the emergency department [redacted] weeks pregnant, with an aura type feeling. According to the patient she typically will get a strange feeling in her head, occasionally with palpitations before a seizure. Tonight the patient states she has been feeling "foggy" and was having some intermittent palpitations. This concerned the patient as she often has symptoms similar to this prior to her seizures. She states the last seizure she had occurred in April. Since that time they've increased her Keppra from 500 twice a day to 750 twice a day. This is the patient's second pregnancy, she states she has not had any seizures with her first pregnancy. Denies any history of preeclampsia or eclampsia. Patient states currently she feels well, no complaints. She states symptoms she was experiencing earlier today have passed.     Past Medical History  Diagnosis Date  . Epilepsy     There are no active problems to display for this patient.   No past surgical history on file.  No current outpatient prescriptions on file.  Allergies Review of patient's allergies indicates not on file.  No family history on file.  Social History History  Substance Use Topics  . Smoking status: Not on file  . Smokeless tobacco: Not on file  . Alcohol Use: Not on file    Review of Systems Constitutional: Negative for fever. Cardiovascular: Negative for chest pain. Respiratory: Negative for shortness of breath. Gastrointestinal: Negative for abdominal pain Genitourinary: Negative for dysuria. No frothy urine. Neurological: Negative for headache 10-point ROS otherwise  negative.  ____________________________________________   PHYSICAL EXAM:  VITAL SIGNS: ED Triage Vitals  Enc Vitals Group     BP 08/15/14 0024 126/60 mmHg     Pulse Rate 08/15/14 0024 97     Resp 08/15/14 0024 18     Temp 08/15/14 0024 98 F (36.7 C)     Temp Source 08/15/14 0024 Oral     SpO2 08/15/14 0024 98 %     Weight 08/15/14 0024 122 lb (55.339 kg)     Height 08/15/14 0024  (1.6 m)     Head Cir --      Peak Flow --      Pain Score 08/15/14 0026 0     Pain Loc --      Pain Edu? --      Excl. in GC? --     Constitutional: Alert and oriented. Well appearing and in no distress. Eyes: Normal exam ENT   Head: Normocephalic and atraumatic. Cardiovascular: Normal rate, regular rhythm. No murmur. No ectopic beats auscultated. Respiratory: Normal respiratory effort without tachypnea nor retractions. Breath sounds are clear and equal bilaterally. No wheezes/rales/rhonchi. Gastrointestinal: Soft, nontender, gravid uterus with good fetal movement. Musculoskeletal: Nontender with normal range of motion in all extremities.  Neurologic:  Normal speech and language. No gross focal neurologic deficits  Skin:  Skin is warm, dry and intact.  Psychiatric: Mood and affect are normal. Speech and behavior are normal. ____________________________________________    INITIAL IMPRESSION / ASSESSMENT AND PLAN / ED COURSE  Pertinent labs & imaging results that were available during my care of the patient were reviewed by me and considered in my  medical decision making (see chart for details).  Very well-appearing patient. No complaints at this time. Given the patient's symptoms we'll check basic labs including CBC, CMP and urinalysis. Patient's vitals including blood pressure are normal.  Labs within normal limits. Urinalysis is equivocal, patient is symptom free, we'll not treat at this time. Patient follow up with her OB/GYN for any further concerns, or the emergency department  for any acute issues.    ____________________________________________   FINAL CLINICAL IMPRESSION(S) / ED DIAGNOSES  Normal exam   Minna Antis, MD 08/15/14 [id number]

## 2014-12-05 IMAGING — US US PELV - US TRANSVAGINAL
1 series · 14 of 25 positions shown · non-contrast
Comparison: CT 11/24/2012.

CLINICAL DATA: LEFT adnexal tenderness. Initial encounter. Last
menstrual period unknown.

EXAM:
TRANSABDOMINAL AND TRANSVAGINAL ULTRASOUND OF PELVIS
TECHNIQUE: Both transabdominal and transvaginal ultrasound examinations of the
pelvis were performed. Transabdominal technique was performed for
global imaging of the pelvis including uterus, ovaries, adnexal
regions, and pelvic cul-de-sac. It was necessary to proceed with
endovaginal exam following the transabdominal exam to visualize the
uterus and adnexa..

[Series 1: us pelv - us transvaginal · 0.17mm/px · 14 of 84 slices shown]
[im 1/84]
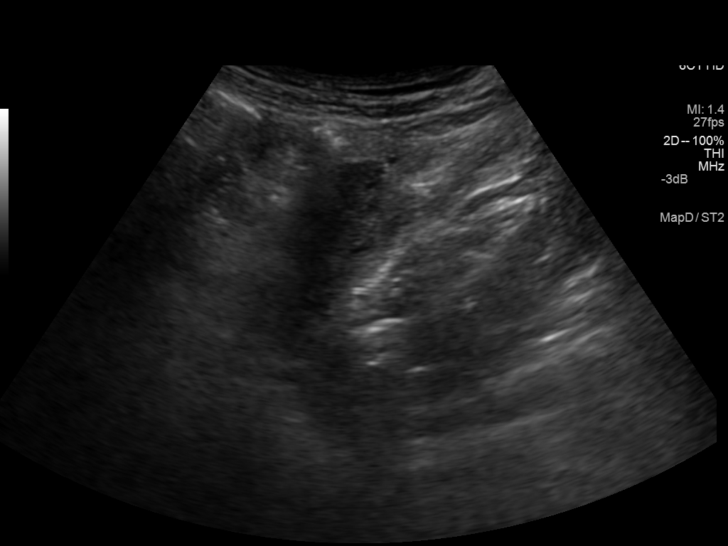
[im 7/84]
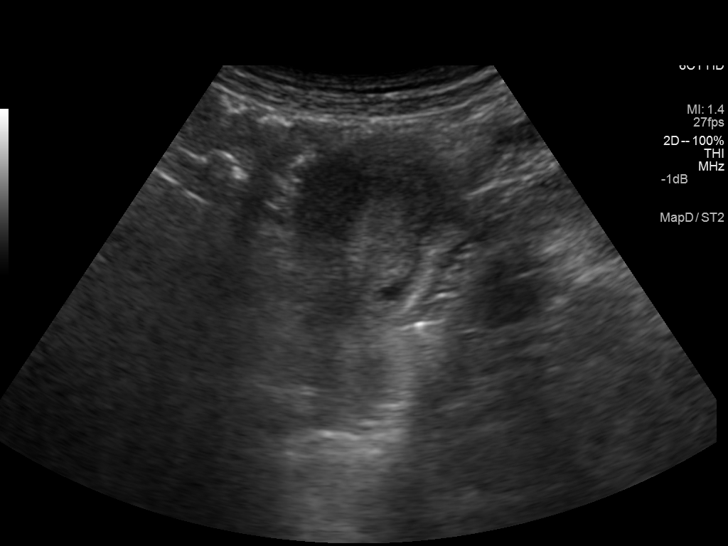
[im 14/84]
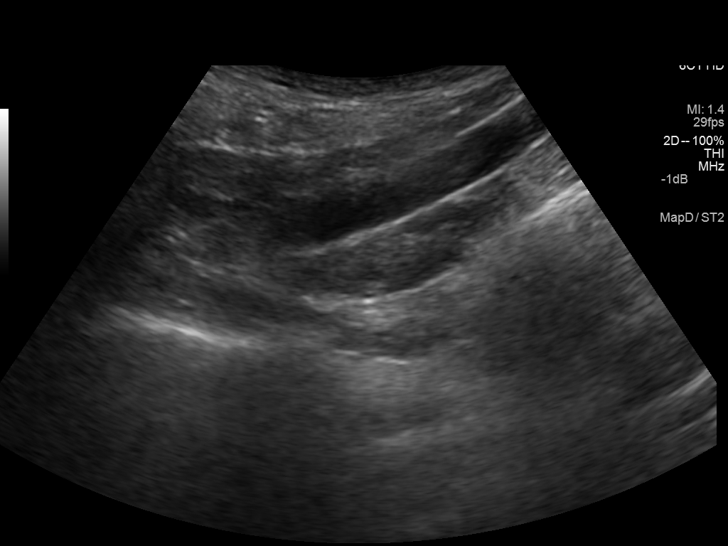
[im 21/84]
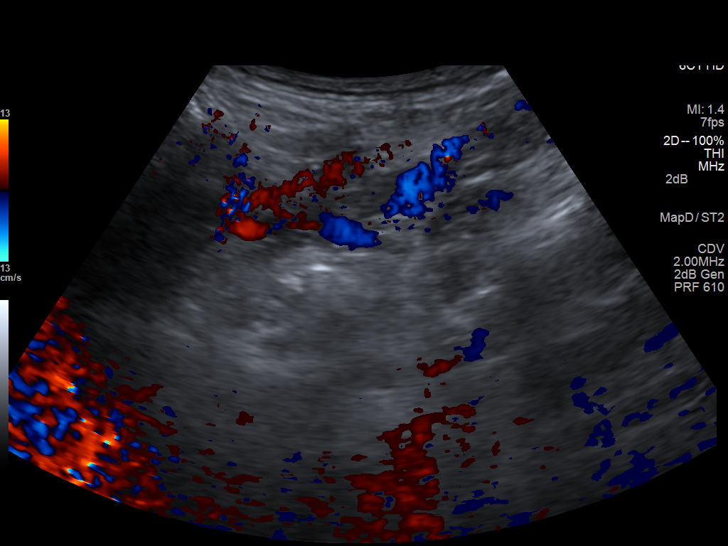
[im 28/84]
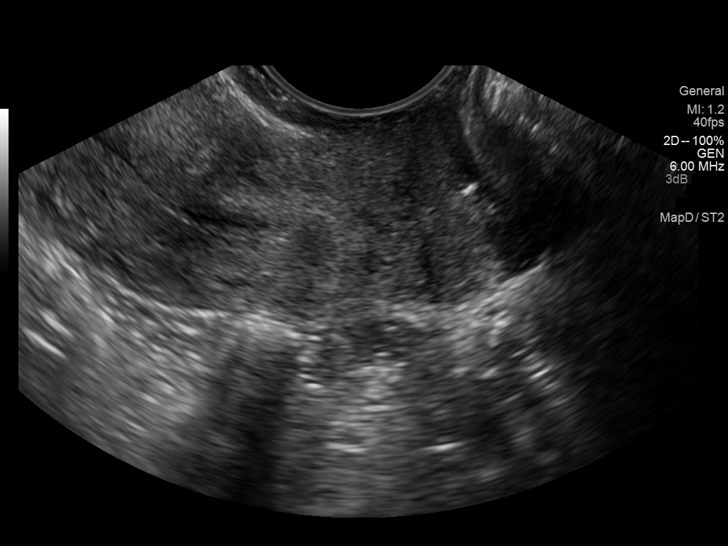
[im 32/84]
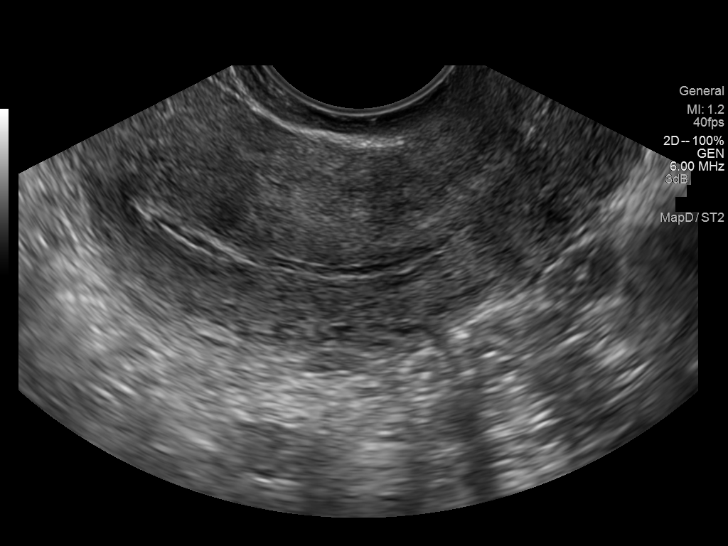
[im 39/84]
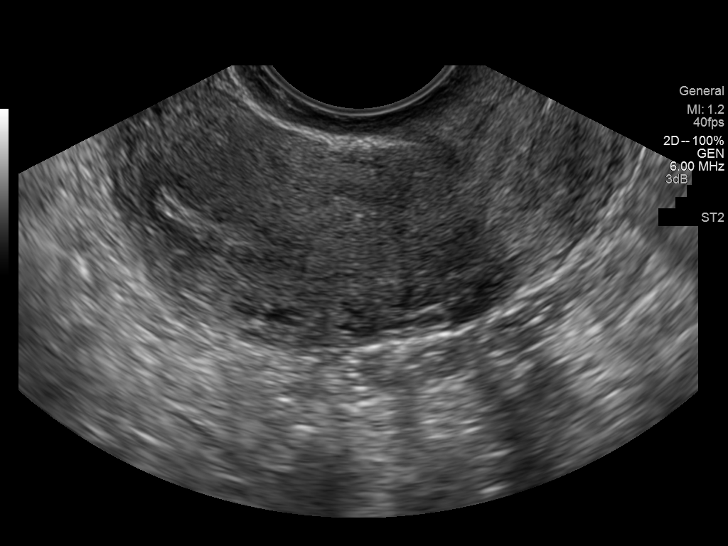
[im 45/84]
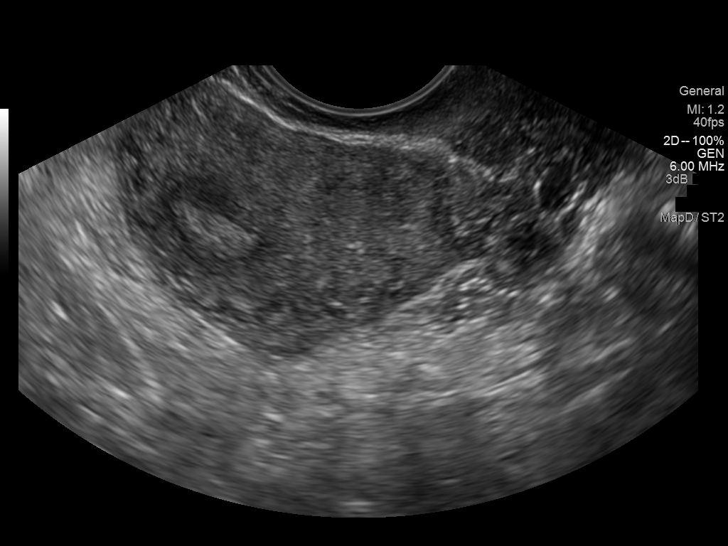
[im 52/84]
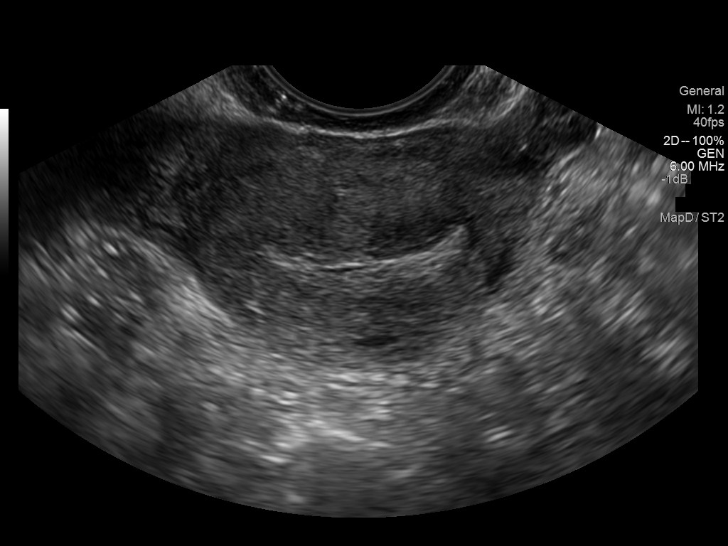
[im 56/84]
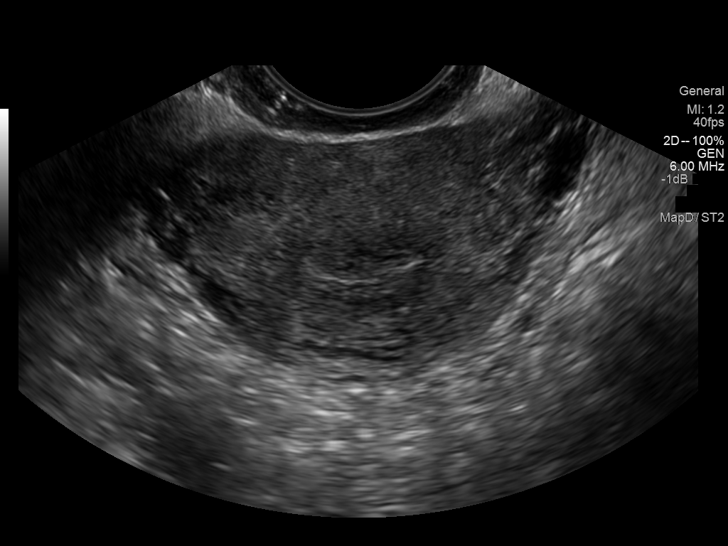
[im 63/84]
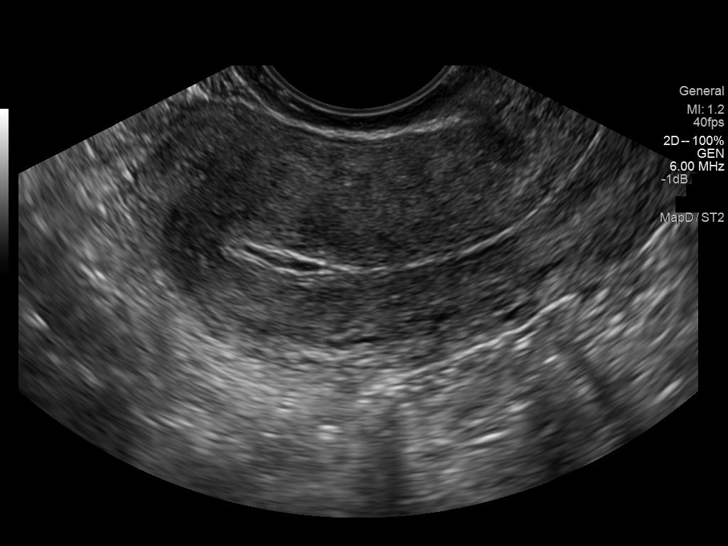
[im 70/84]
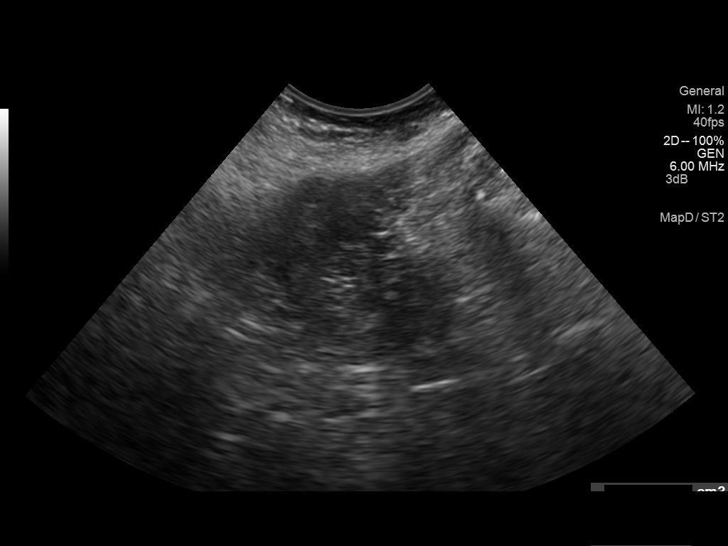
[im 77/84]
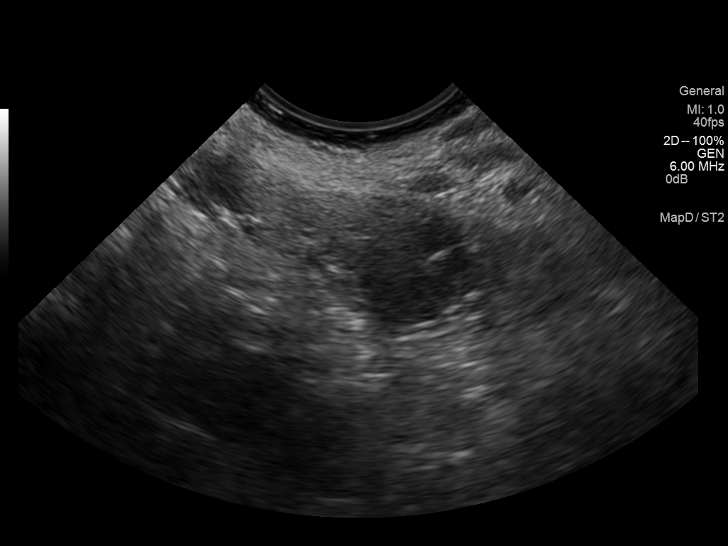
[im 84/84]
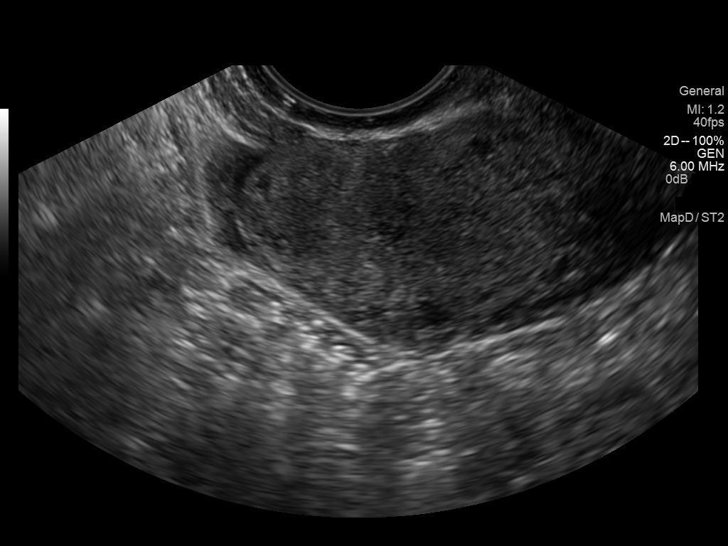

[14 of 25 positions shown; findings below may reference images not displayed]

FINDINGS: Uterus

Measurements: 59 mm x 28 mm x 45 mm. Normal myometrial echotexture..
No fibroids or other mass visualized.

Endometrium

Thickness: 4 mm, normal..  No focal abnormality visualized.

Right ovary

Measurements: 32 mm x 17 mm x 18 mm. Normal appearance/no adnexal
mass.

Left ovary

Measurements: 24 mm x 12 mm x 19 mm. Normal appearance/no adnexal
mass.

Other findings

Trace free fluid is physiologic.
IMPRESSION: Normal pelvic ultrasound.

## 2015-04-30 DIAGNOSIS — F17200 Nicotine dependence, unspecified, uncomplicated: Secondary | ICD-10-CM | POA: Insufficient documentation

## 2016-05-18 ENCOUNTER — Encounter: Payer: Self-pay | Admitting: Emergency Medicine

## 2016-05-18 ENCOUNTER — Emergency Department
Admission: EM | Admit: 2016-05-18 | Discharge: 2016-05-18 | Disposition: A | Discharge status: Home / Self Care | Payer: Medicaid Other | Attending: Emergency Medicine | Admitting: Emergency Medicine

## 2016-05-18 DIAGNOSIS — B349 Viral infection, unspecified: Secondary | ICD-10-CM | POA: Insufficient documentation

## 2016-05-18 DIAGNOSIS — J45909 Unspecified asthma, uncomplicated: Secondary | ICD-10-CM | POA: Insufficient documentation

## 2016-05-18 DIAGNOSIS — F17210 Nicotine dependence, cigarettes, uncomplicated: Secondary | ICD-10-CM | POA: Insufficient documentation

## 2016-05-18 HISTORY — DX: Unspecified asthma, uncomplicated: J45.909

## 2016-05-18 NOTE — ED Notes (Signed)
Pt to ed with c/o cough, sore throat, fever and congestion that started on Thursday.  Pt states headache and "I feel like crap"

## 2016-05-18 NOTE — ED Triage Notes (Signed)
C/O cough, sinus congestion, headache x 1 week.  Intermittent fever

## 2016-05-19 NOTE — ED Provider Notes (Signed)
[facility] Emergency Department Provider Note   ____________________________________________    I have reviewed the triage vital signs and the nursing notes.   HISTORY  Chief Complaint Fever and Cough     HPI Melissa Dominguez is a 26 y.o. female who presents with complaints of myalgias, cough, congestion, fatigue for the last 5 days. She reports her children have had similar symptoms. She denies shortness of breath to me. No chest pain. Mild nausea but no vomiting or abdominal pain.   Past Medical History:  Diagnosis Date  . Asthma   . Epilepsy (HCC)     There are no active problems to display for this patient.   History reviewed. No pertinent surgical history.  Prior to Admission medications   Not on File     Allergies Patient has no known allergies.  No family history on file.  Social History Social History  Substance Use Topics  . Smoking status: Current Every Day Smoker    Types: Cigarettes  . Smokeless tobacco: Never Used  . Alcohol use No    Review of Systems  Constitutional: Subjective fever  ENT: Mild sore throat   Gastrointestinal: No abdominal pain.     Genitourinary: Negative for dysuria. Musculoskeletal: Negative for back pain.Marland Kitchen Positive for myalgias Skin: Negative for rash. Neurological: Intermittent headaches    ____________________________________________   PHYSICAL EXAM:  VITAL SIGNS: ED Triage Vitals  Enc Vitals Group     BP 05/18/16 1339 111/64     Pulse Rate 05/18/16 1339 79     Resp 05/18/16 1339 16     Temp 05/18/16 1339 98 F (36.7 C)     Temp Source 05/18/16 1339 Oral     SpO2 05/18/16 1339 100 %     Weight 05/18/16 1340 120 lb (54.4 kg)     Height 05/18/16 1340 5\' 3"  (1.6 m)     Head Circumference --      Peak Flow --      Pain Score 05/18/16 1338 6     Pain Loc --      Pain Edu? --      Excl. in GC? --     Constitutional: Alert and oriented. No acute distress. Pleasant and  interactive Eyes: Conjunctivae are normal.   Nose: No congestion/rhinnorhea.Normal pharynx Mouth/Throat: Mucous membranes are moist.   Cardiovascular: Normal rate, regular rhythm.  Respiratory: Normal respiratory effort.  No retractions.Clear to auscultation bilaterally Genitourinary: deferred Musculoskeletal: No lower extremity tenderness nor edema.   Neurologic:  Normal speech and language. No gross focal neurologic deficits are appreciated.   Skin:  Skin is warm, dry and intact. No rash noted.   ____________________________________________   LABS (all labs ordered are listed, but only abnormal results are displayed)  Labs Reviewed - No data to display ____________________________________________  EKG   ____________________________________________  RADIOLOGY  None ____________________________________________   PROCEDURES  Procedure(s) performed: No    Critical Care performed: No ____________________________________________   INITIAL IMPRESSION / ASSESSMENT AND PLAN / ED COURSE  Pertinent labs & imaging results that were available during my care of the patient were reviewed by me and considered in my medical decision making (see chart for details).  Patient well-appearing and in no acute distress. Vitals are normal. Symptoms are most consistent with upper respiratory infection, likely viral, possibly influenza but given that her symptoms have been going on for greater than 5 days no indication for Tamiflu further treatment. Recommend supportive care care.   ____________________________________________  FINAL CLINICAL IMPRESSION(S) / ED DIAGNOSES  Final diagnoses:  Viral illness      NEW MEDICATIONS STARTED DURING THIS VISIT:  There are no discharge medications for this patient.    Note:  This document was prepared using Dragon voice recognition software and may include unintentional dictation errors.    Jene EveryKinner, Shakala Marlatt, MD 05/19/16 1318

## 2016-09-24 ENCOUNTER — Emergency Department: Payer: Self-pay

## 2016-09-24 ENCOUNTER — Other Ambulatory Visit: Payer: Self-pay

## 2016-09-24 ENCOUNTER — Encounter: Payer: Self-pay | Admitting: Emergency Medicine

## 2016-09-24 DIAGNOSIS — R002 Palpitations: Secondary | ICD-10-CM | POA: Insufficient documentation

## 2016-09-24 DIAGNOSIS — F17210 Nicotine dependence, cigarettes, uncomplicated: Secondary | ICD-10-CM | POA: Insufficient documentation

## 2016-09-24 DIAGNOSIS — J45909 Unspecified asthma, uncomplicated: Secondary | ICD-10-CM | POA: Insufficient documentation

## 2016-09-24 LAB — CBC
HCT: 38 % (ref 35.0–47.0)
Hemoglobin: 13.4 g/dL (ref 12.0–16.0)
MCH: 32.8 pg (ref 26.0–34.0)
MCHC: 35.2 g/dL (ref 32.0–36.0)
MCV: 93 fL (ref 80.0–100.0)
Platelets: 206 10*3/uL (ref 150–440)
RBC: 4.09 MIL/uL (ref 3.80–5.20)
RDW: 13.3 % (ref 11.5–14.5)
WBC: 10.4 10*3/uL (ref 3.6–11.0)

## 2016-09-24 LAB — BASIC METABOLIC PANEL
Anion gap: 8 (ref 5–15)
BUN: 12 mg/dL (ref 6–20)
CALCIUM: 9.1 mg/dL (ref 8.9–10.3)
CO2: 23 mmol/L (ref 22–32)
CREATININE: 0.71 mg/dL (ref 0.44–1.00)
Chloride: 107 mmol/L (ref 101–111)
GFR calc non Af Amer: >60 mL/min (ref >60)
Glucose, Bld: 80 mg/dL (ref 65–99)
Potassium: 3.9 mmol/L (ref 3.5–5.1)
SODIUM: 138 mmol/L (ref 135–145)

## 2016-09-24 LAB — TROPONIN I

## 2016-09-24 NOTE — ED Triage Notes (Signed)
Pt was at work this evening at noticed that her heart was beating fast. Pt has hx of epilepsy and reports that she had a seizure Tuesday and has had episodes of tachycardia since that time. Pt is ambulatory to triage with NAD.

## 2016-09-24 NOTE — ED Notes (Addendum)
Pt walks in, states heart rate has been "high" since Tuesday. Pt alert and oriented X4, active, cooperative, pt in NAD. RR even and unlabored, color WNL.

## 2016-09-25 ENCOUNTER — Emergency Department
Admission: EM | Admit: 2016-09-25 | Discharge: 2016-09-25 | Disposition: A | Discharge status: Home / Self Care | Payer: Self-pay | Attending: Emergency Medicine | Admitting: Emergency Medicine

## 2016-09-25 DIAGNOSIS — R002 Palpitations: Secondary | ICD-10-CM

## 2016-09-25 LAB — FIBRIN DERIVATIVES D-DIMER (ARMC ONLY): Fibrin derivatives D-dimer (ARMC): 124.1 (ref 0.00–499.00)

## 2016-09-25 NOTE — Discharge Instructions (Signed)
Return to the ER for new or worsening palpitations, persistent high heart rate, difficulty breathing, chest pain, lightheadedness, or any other new or worsening symptoms that concern you.  Follow up with your primary care doctor and with a cardiologist.

## 2016-09-25 NOTE — ED Notes (Signed)
Pt up to desk to inform this RN that she is going outside for a few minutes. Pt ambulating with steady gait in NAD.

## 2016-09-25 NOTE — ED Provider Notes (Signed)
[facility] Emergency Department Provider Note ____________________________________________   First MD Initiated Contact with Patient 09/25/16 0214     (approximate)  I have reviewed the triage vital signs and the nursing notes.   HISTORY  Chief Complaint Tachycardia    HPI Melissa Dominguez is a 26 y.o. female With history of seizure disorder and asthma who presents with tachycardia, described as feeling like her heart rate is elevated, occurring intermittently over the last 3-4 days, and associated with palpitations. Patient also states that she sometimes has some chest tightness during these episodes but denies lightheadedness or difficulty breathing. She denies any recent leg pain or swelling. Patient states that she has measured a heart rate as high as 140 bpm. Patient states that this high heart rate started occurring ever since she had a seizure earlier this week. She states her last seizure was approximately a year ago and she is compliant with her Lamictal. She states she is on birth control and does smoke. No recent significant travel, surgery or immobilization.  Past Medical History:  Diagnosis Date  . Asthma   . Epilepsy (HCC)     There are no active problems to display for this patient.   History reviewed. No pertinent surgical history.  Prior to Admission medications   Not on File    Allergies Patient has no known allergies.  No family history on file.  Social History Social History  Substance Use Topics  . Smoking status: Current Every Day Smoker    Types: Cigarettes  . Smokeless tobacco: Never Used  . Alcohol use No    Review of Systems  Constitutional: No fever/chills Eyes: No visual changes. ENT: No sore throat. Cardiovascular: Positive for chest tightness.  Respiratory: Denies shortness of breath. Gastrointestinal: No nausea, no vomiting.  No diarrhea.  Genitourinary: Negative for dysuria.  Musculoskeletal: Negative  for back pain. Skin: Negative for rash. Neurological: Negative for headaches, focal weakness or numbness.   ____________________________________________   PHYSICAL EXAM:  VITAL SIGNS: ED Triage Vitals  Enc Vitals Group     BP 09/24/16 2140 120/63     Pulse Rate 09/24/16 2140 98     Resp 09/24/16 2140 18     Temp 09/24/16 2140 98.7 F (37.1 C)     Temp Source 09/24/16 2140 Oral     SpO2 09/24/16 2140 98 %     Weight 09/24/16 2140 115 lb (52.2 kg)     Height 09/24/16 2140  (1.6 m)     Head Circumference --      Peak Flow --      Pain Score 09/24/16 2139 5     Pain Loc --      Pain Edu? --      Excl. in GC? --     Constitutional: Alert and oriented. Well appearing and in no acute distress. Eyes: Conjunctivae are normal.  Head: Atraumatic. Nose: No congestion/rhinnorhea. Mouth/Throat: Mucous membranes are moist.   Neck: Normal range of motion.  Cardiovascular: Normal rate, regular rhythm. Grossly normal heart sounds.  Good peripheral circulation. Respiratory: Normal respiratory effort.  No retractions. Lungs CTAB. Gastrointestinal: Soft and nontender. No distention.  Genitourinary: No CVA tenderness. Musculoskeletal: No lower extremity edema.  Extremities warm and well perfused. No calf swelling or tenderness.  Neurologic:  Normal speech and language. No gross focal neurologic deficits are appreciated.  Skin:  Skin is warm and dry. No rash noted. Psychiatric: Mood and affect are normal. Speech and behavior are  normal.  ____________________________________________   LABS (all labs ordered are listed, but only abnormal results are displayed)  Labs Reviewed  BASIC METABOLIC PANEL  CBC  TROPONIN I  FIBRIN DERIVATIVES D-DIMER ([facility] ONLY)   ____________________________________________  EKG  ED ECG REPORT I, Dionne Bucy, the attending physician, personally viewed and interpreted this ECG.  Date: 09/25/2016 EKG Time: 2145 Rate: 113 Rhythm: sinus  tachycardia QRS Axis: right axis deviation Intervals: incomplete right bundle-branch block ST/T Wave abnormalities: normal Narrative Interpretation: no evidence of acute ischemia, no significant change from EKG dated 03/09/13.   ____________________________________________  RADIOLOGY  Chest Xray shows chronic lung hyperinflation, no acute findings.   ____________________________________________   PROCEDURES  Procedure(s) performed: No    Critical Care performed: No ____________________________________________   INITIAL IMPRESSION / ASSESSMENT AND PLAN / ED COURSE  Pertinent labs & imaging results that were available during my care of the patient were reviewed by me and considered in my medical decision making (see chart for details).  26 year old female with history of seizure disorder and asthma presents with palpitations, elevated heart rate and chest tightness ever since she had a seizure several days ago. Patient was tachycardic at triage but currently vital signs are normal. Patient is well-appearing, and exam is unremarkable. Neuro exam is normal. The EKG shows no ischemic changes; there is right axis and incomplete right bundle but this is unchanged from prior. Overall patient has no risk factors or clinical evidence to suggest ACS. The EKG does not show any findings to suggest specific arrhythmia such as WPW. Clinical presentation is slightly concerning for PE given the patient is on birth control and smokes, but she is otherwise low risk.  She fails PERC due to elevated HR.  Plan: basic labs, trop x1 given duration of sx, and d-dimer as pt overall low risk for PE.  If negative, d/c home with PMD followup and cardiology referral as pt may need holter and/or other outpatient workup.  ----------------------------------------- 3:47 AM on 09/25/2016 -----------------------------------------  Patient remains comfortable, and the troponin and d-dimer are both negative. Safe for  discharge home. I gave patient extensive return precautions, instructed her to follow up with her primary care doctor and will give cardiology referral.      ____________________________________________   FINAL CLINICAL IMPRESSION(S) / ED DIAGNOSES  Final diagnoses:  Palpitations      NEW MEDICATIONS STARTED DURING THIS VISIT:  New Prescriptions   No medications on file     Note:  This document was prepared using Dragon voice recognition software and may include unintentional dictation errors.    Dionne Bucy, MD 09/25/16 [phone]

## 2018-04-13 ENCOUNTER — Emergency Department: Payer: Self-pay

## 2018-04-13 ENCOUNTER — Emergency Department
Admission: EM | Admit: 2018-04-13 | Discharge: 2018-04-13 | Disposition: A | Discharge status: Home / Self Care | Payer: Self-pay | Attending: Emergency Medicine | Admitting: Emergency Medicine

## 2018-04-13 ENCOUNTER — Other Ambulatory Visit: Payer: Self-pay

## 2018-04-13 DIAGNOSIS — N200 Calculus of kidney: Secondary | ICD-10-CM | POA: Insufficient documentation

## 2018-04-13 DIAGNOSIS — F17210 Nicotine dependence, cigarettes, uncomplicated: Secondary | ICD-10-CM | POA: Insufficient documentation

## 2018-04-13 DIAGNOSIS — J45909 Unspecified asthma, uncomplicated: Secondary | ICD-10-CM | POA: Insufficient documentation

## 2018-04-13 LAB — COMPREHENSIVE METABOLIC PANEL
ALT: 10 U/L (ref 0–44)
AST: 16 U/L (ref 15–41)
Albumin: 4.1 g/dL (ref 3.5–5.0)
Alkaline Phosphatase: 51 U/L (ref 38–126)
Anion gap: 9 (ref 5–15)
BUN: 10 mg/dL (ref 6–20)
CO2: 21 mmol/L — ABNORMAL LOW (ref 22–32)
Calcium: 9 mg/dL (ref 8.9–10.3)
Chloride: 108 mmol/L (ref 98–111)
Creatinine, Ser: 0.77 mg/dL (ref 0.44–1.00)
GFR calc Af Amer: >60 mL/min (ref >60)
GFR calc non Af Amer: >60 mL/min (ref >60)
Glucose, Bld: 114 mg/dL — ABNORMAL HIGH (ref 70–99)
Potassium: 3.7 mmol/L (ref 3.5–5.1)
Sodium: 138 mmol/L (ref 135–145)
Total Bilirubin: 0.6 mg/dL (ref 0.3–1.2)
Total Protein: 7.2 g/dL (ref 6.5–8.1)

## 2018-04-13 LAB — URINALYSIS, COMPLETE (UACMP) WITH MICROSCOPIC
Bilirubin Urine: NEGATIVE
Glucose, UA: NEGATIVE mg/dL
Ketones, ur: NEGATIVE mg/dL
Nitrite: NEGATIVE
Protein, ur: NEGATIVE mg/dL
Specific Gravity, Urine: 1.015 (ref 1.005–1.030)
pH: 6 (ref 5.0–8.0)

## 2018-04-13 LAB — CBC
HCT: 39.3 % (ref 36.0–46.0)
Hemoglobin: 12.6 g/dL (ref 12.0–15.0)
MCH: 31.8 pg (ref 26.0–34.0)
MCHC: 32.1 g/dL (ref 30.0–36.0)
MCV: 99.2 fL (ref 80.0–100.0)
Platelets: 205 10*3/uL (ref 150–400)
RBC: 3.96 MIL/uL (ref 3.87–5.11)
RDW: 12.2 % (ref 11.5–15.5)
WBC: 7.8 10*3/uL (ref 4.0–10.5)
nRBC: 0 % (ref 0.0–0.2)

## 2018-04-13 MED ORDER — SODIUM CHLORIDE 0.9 % IV BOLUS
1000.0000 mL | Freq: Once | INTRAVENOUS | Status: AC
  Administered 2018-04-13: 1000 mL via INTRAVENOUS

## 2018-04-13 MED ORDER — OXYCODONE-ACETAMINOPHEN 5-325 MG PO TABS: 1.0000 | ORAL_TABLET | ORAL | 0 refills | Status: AC | PRN

## 2018-04-13 MED ORDER — KETOROLAC TROMETHAMINE 30 MG/ML IJ SOLN
30.0000 mg | Freq: Once | INTRAMUSCULAR | Status: AC
  Administered 2018-04-13: 30 mg via INTRAVENOUS
  Filled 2018-04-13: qty 1

## 2018-04-13 MED ORDER — SODIUM CHLORIDE 0.9 % IV SOLN
1.0000 g | Freq: Once | INTRAVENOUS | Status: AC
  Administered 2018-04-13: 1 g via INTRAVENOUS
  Filled 2018-04-13: qty 10

## 2018-04-13 MED ORDER — CEPHALEXIN 500 MG PO CAPS: 500.0000 mg | ORAL_CAPSULE | Freq: Two times a day (BID) | ORAL | 0 refills | Status: AC

## 2018-04-13 MED ORDER — ONDANSETRON HCL 4 MG/2ML IJ SOLN
4.0000 mg | Freq: Once | INTRAMUSCULAR | Status: AC
  Administered 2018-04-13: 4 mg via INTRAVENOUS
  Filled 2018-04-13: qty 2

## 2018-04-13 NOTE — ED Triage Notes (Signed)
Pt in with co left lower back pain x 3 weeks with some dysuria.

## 2018-04-13 NOTE — ED Notes (Signed)
Patient transported to CT 

## 2018-04-13 NOTE — ED Provider Notes (Addendum)
[facility] Emergency Department Provider Note    First MD Initiated Contact with Patient 04/13/18 [phone]     (approximate)  I have reviewed the triage vital signs and the nursing notes.   HISTORY  Chief Complaint Back Pain    HPI Melissa Dominguez is a 28 y.o. female with medical history as listed below presents to the emergency department a 3-week history of dysuria and 1 day history of back pain.  Patient denies any fever.  Patient denies any vomiting however does admit to nausea.  Patient states that she presumed her symptoms to be secondary to urinary tract infection and as such took Bactrim DS that was prescribed to her sister twice daily for 3 days however symptoms never resolved.   Past Medical History:  Diagnosis Date  . Asthma   . Epilepsy (HCC)     There are no active problems to display for this patient.   No past surgical history on file.  Prior to Admission medications   Medication Sig Start Date End Date Taking? Authorizing Provider  cephALEXin (KEFLEX) 500 MG capsule Take 1 capsule (500 mg total) by mouth 2 (two) times daily for 10 days. 04/13/18 04/23/18  Darci Current, MD  oxyCODONE-acetaminophen (PERCOCET) 5-325 MG tablet Take 1 tablet by mouth every 4 (four) hours as needed. 04/13/18 04/13/19  Darci Current, MD    Allergies Patient has no known allergies.  No family history on file.  Social History Social History   Tobacco Use  . Smoking status: Current Every Day Smoker    Types: Cigarettes  . Smokeless tobacco: Never Used  Substance Use Topics  . Alcohol use: No  . Drug use: No    Review of Systems Constitutional: No fever/chills Eyes: No visual changes. ENT: No sore throat. Cardiovascular: Denies chest pain. Respiratory: Denies shortness of breath. Gastrointestinal: No abdominal pain.  No nausea, no vomiting.  No diarrhea.  No constipation. Genitourinary: Positive for dysuria Musculoskeletal: Negative for neck  pain.  Positive for back pain. Integumentary: Negative for rash. Neurological: Negative for headaches, focal weakness or numbness.  ____________________________________________   PHYSICAL EXAM:  VITAL SIGNS: ED Triage Vitals  Enc Vitals Group     BP 04/13/18 0353 127/72     Pulse Rate 04/13/18 0353 86     Resp 04/13/18 0353 18     Temp 04/13/18 0353 97.7 F (36.5 C)     Temp Source 04/13/18 0353 Oral     SpO2 04/13/18 0353 98 %     Weight 04/13/18 0347 60.3 kg (133 lb)     Height --      Head Circumference --      Peak Flow --      Pain Score 04/13/18 0346 9     Pain Loc --      Pain Edu? --      Excl. in GC? --     Constitutional: Alert and oriented.  Apparent discomfort  eyes: Conjunctivae are normal.  Mouth/Throat: Mucous membranes are moist. Oropharynx non-erythematous. Neck: No stridor.   Cardiovascular: Normal rate, regular rhythm. Good peripheral circulation. Grossly normal heart sounds. Respiratory: Normal respiratory effort.  No retractions. Lungs CTAB. Gastrointestinal: Soft and nontender. No distention.  Musculoskeletal: No lower extremity tenderness nor edema. No gross deformities of extremities. Neurologic:  Normal speech and language. No gross focal neurologic deficits are appreciated.  Skin:  Skin is warm, dry and intact. No rash noted. Psychiatric: Mood and affect are normal. Speech  and behavior are normal.  ____________________________________________   LABS (all labs ordered are listed, but only abnormal results are displayed)  Labs Reviewed  COMPREHENSIVE METABOLIC PANEL - Abnormal; Notable for the following components:      Result Value   CO2 21 (*)    Glucose, Bld 114 (*)    All other components within normal limits  URINALYSIS, COMPLETE (UACMP) WITH MICROSCOPIC - Abnormal; Notable for the following components:   Color, Urine AMBER (*)    APPearance HAZY (*)    Hgb urine dipstick SMALL (*)    Leukocytes,Ua TRACE (*)    Bacteria, UA RARE  (*)    All other components within normal limits  URINE CULTURE  CBC   _______________________________  RADIOLOGY I, Fraser N , personally viewed and evaluated these images (plain radiographs) as part of my medical decision making, as well as reviewing the written report by the radiologist.  ED MD interpretation: 5 x 4 left UVJ stone millimeter  Official radiology report(s): Ct Renal Stone Study  Result Date: 04/13/2018 CLINICAL DATA:  Left lower back pain for 3 weeks with dysuria EXAM: CT ABDOMEN AND PELVIS WITHOUT CONTRAST TECHNIQUE: Multidetector CT imaging of the abdomen and pelvis was performed following the standard protocol without IV contrast. COMPARISON:  11/24/2012 FINDINGS: Lower chest:  No contributory findings. Hepatobiliary: No focal liver abnormality.No evidence of biliary obstruction or stone. Pancreas: Unremarkable. Spleen: Unremarkable. Adrenals/Urinary Tract: Negative adrenals. Left hydroureteronephrosis due to a 5 x 4 mm stone at the UVJ. Two left renal calculi measuring up to 3 mm at the upper pole. High-density right renal papillae without macroscopic/measurable stone. Unremarkable bladder. Stomach/Bowel:  No obstruction. No appendicitis. Vascular/Lymphatic: No acute vascular abnormality. No mass or adenopathy. Reproductive:No pathologic findings.  Right-sided dominant follicle. Other: No ascites or pneumoperitoneum. Musculoskeletal: No acute abnormalities. IMPRESSION: 1. Obstructing 5 x 4 mm stone at the left UVJ. 2. Left nephrolithiasis. Electronically Signed   By: Marnee Spring M.D.   On: 04/13/2018 06:07      Procedures   ____________________________________________   INITIAL IMPRESSION / MDM / ASSESSMENT AND PLAN / ED COURSE  As part of my medical decision making, I reviewed the following data within the electronic MEDICAL RECORD NUMBER   28 year old female presenting with above-stated history and physical exam secondary to left flank pain and dysuria.   Patient given IV Toradol 30 mg with improvement of pain.  Patient's urinalysis revealed trace leukocytes rare bacteria no nitrites.  CT scan revealed a 5 x 4 mm left UVJ stone which I suspect to be the etiology of the patient's discomfort.  Patient will be prescribed Percocet and Keflex for home with recommendation to follow-up with urologist.  Patient is afebrile and nontoxic-appearing.  Patient advised of warning signs that would warrant immediate return to the emergency department. ____________________________________________  FINAL CLINICAL IMPRESSION(S) / ED DIAGNOSES  Final diagnoses:  Kidney stone on left side     MEDICATIONS GIVEN DURING THIS VISIT:  Medications  cefTRIAXone (ROCEPHIN) 1 g in sodium chloride 0.9 % 100 mL IVPB (0 g Intravenous Stopped 04/13/18 0501)  ketorolac (TORADOL) 30 MG/ML injection 30 mg (30 mg Intravenous Given 04/13/18 0421)  ondansetron (ZOFRAN) injection 4 mg (4 mg Intravenous Given 04/13/18 0420)  sodium chloride 0.9 % bolus 1,000 mL (1,000 mLs Intravenous New Bag/Given 04/13/18 0416)     ED Discharge Orders         Ordered    cephALEXin (KEFLEX) 500 MG capsule  2 times daily  04/13/18 0637    oxyCODONE-acetaminophen (PERCOCET) 5-325 MG tablet  Every 4 hours PRN     04/13/18 7858           Note:  This document was prepared using Dragon voice recognition software and may include unintentional dictation errors.   Darci Current, MD 04/13/18 8502    Darci Current, MD 04/13/18 7741    Darci Current, MD 04/13/18 [phone]

## 2018-04-14 LAB — URINE CULTURE: Culture: <10000 — AB

## 2018-04-16 NOTE — Progress Notes (Deleted)
04/17/2018 9:24 PM   Dot Lanes 28-Jun-1990 [id number]  Referring provider: No referring provider defined for this encounter.  No chief complaint on file.   HPI: Patient is a 28 year old Caucasian female who was referred by [facility] ED for nephrolithiasis.    She presented to the ED on 04/13/2018 with the complaint of three weeks of dysuria and back pain associated with nausea.  CT Renal stone study on 04/13/2018 noted an obstructing 5 x 4 mm stone at the left UVJ. 2. Left nephrolithiasis.   Labs in the ED:  Urine culture with insufficient growth.  UA 21-50 RBC's, 11-20 WBC's and rare bacteria.     Meds given in the ED: Rocephin, Keflex, Zofran and Percocet.    Prior urological history:  ***   Current NSAID/anticoagulation:   ***   Today: ***    PMH: Past Medical History:  Diagnosis Date  . Asthma   . Epilepsy [facility])     Surgical History: No past surgical history on file.  Home Medications:  Allergies as of 04/17/2018   No Known Allergies     Medication List       Accurate as of April 16, 2018  9:24 PM. Always use your most recent med list.        cephALEXin 500 MG capsule Commonly known as:  KEFLEX Take 1 capsule (500 mg total) by mouth 2 (two) times daily for 10 days.   oxyCODONE-acetaminophen 5-325 MG tablet Commonly known as:  Percocet Take 1 tablet by mouth every 4 (four) hours as needed.       Allergies: No Known Allergies  Family History: No family history on file.  Social History:  reports that she has been smoking cigarettes. She has never used smokeless tobacco. She reports that she does not drink alcohol or use drugs.  ROS:                                        Physical Exam: There were no vitals taken for this visit.  Constitutional:  Well nourished. Alert and oriented, No acute distress. HEENT: [place] AT, moist mucus membranes.  Trachea midline, no masses. Cardiovascular: No clubbing, cyanosis, or edema.  Respiratory: Normal respiratory effort, no increased work of breathing. GI: Abdomen is soft, non tender, non distended, no abdominal masses. Liver and spleen not palpable.  No hernias appreciated.  Stool sample for occult testing is not indicated.   GU: No CVA tenderness.  No bladder fullness or masses.  Patient with circumcised/uncircumcised phallus. ***Foreskin easily retracted***  Urethral meatus is patent.  No penile discharge. No penile lesions or rashes. Scrotum without lesions, cysts, rashes and/or edema.  Testicles are located scrotally bilaterally. No masses are appreciated in the testicles. Left and right epididymis are normal. Rectal: Patient with  normal sphincter tone. Anus and perineum without scarring or rashes. No rectal masses are appreciated. Prostate is approximately *** grams, *** nodules are appreciated. Seminal vesicles are normal. Skin: No rashes, bruises or suspicious lesions. Lymph: No cervical or inguinal adenopathy. Neurologic: Grossly intact, no focal deficits, moving all 4 extremities. Psychiatric: Normal mood and affect.  Laboratory Data: Lab Results  Component Value Date   WBC 7.8 04/13/2018   HGB 12.6 04/13/2018   HCT 39.3 04/13/2018   MCV 99.2 04/13/2018   PLT 205 04/13/2018    Lab Results  Component Value Date   CREATININE 0.77  04/13/2018    No results found for: PSA  No results found for: TESTOSTERONE  No results found for: HGBA1C  Lab Results  Component Value Date   TSH 1.38 02/10/2012    No results found for: CHOL, HDL, CHOLHDL, VLDL, LDLCALC  Lab Results  Component Value Date   AST 16 04/13/2018   Lab Results  Component Value Date   ALT 10 04/13/2018   No components found for: ALKALINEPHOPHATASE No components found for: BILIRUBINTOTAL  No results found for: ESTRADIOL  Urinalysis    Component Value Date/Time   COLORURINE AMBER (A) 04/13/2018 0411   APPEARANCEUR HAZY (A) 04/13/2018 0411   APPEARANCEUR Clear 12/07/2013 1712    LABSPEC 1.015 04/13/2018 0411   LABSPEC 1.026 12/07/2013 1712   PHURINE 6.0 04/13/2018 0411   GLUCOSEU NEGATIVE 04/13/2018 0411   GLUCOSEU Negative 12/07/2013 1712   HGBUR SMALL (A) 04/13/2018 0411   BILIRUBINUR NEGATIVE 04/13/2018 0411   BILIRUBINUR Negative 12/07/2013 1712   KETONESUR NEGATIVE 04/13/2018 0411   PROTEINUR NEGATIVE 04/13/2018 0411   NITRITE NEGATIVE 04/13/2018 0411   LEUKOCYTESUR TRACE (A) 04/13/2018 0411   LEUKOCYTESUR Negative 12/07/2013 1712    I have reviewed the labs.   Pertinent Imaging: CLINICAL DATA:  Left lower back pain for 3 weeks with dysuria  EXAM: CT ABDOMEN AND PELVIS WITHOUT CONTRAST  TECHNIQUE: Multidetector CT imaging of the abdomen and pelvis was performed following the standard protocol without IV contrast.  COMPARISON:  11/24/2012  FINDINGS: Lower chest:  No contributory findings.  Hepatobiliary: No focal liver abnormality.No evidence of biliary obstruction or stone.  Pancreas: Unremarkable.  Spleen: Unremarkable.  Adrenals/Urinary Tract: Negative adrenals. Left hydroureteronephrosis due to a 5 x 4 mm stone at the UVJ. Two left renal calculi measuring up to 3 mm at the upper pole. High-density right renal papillae without macroscopic/measurable stone. Unremarkable bladder.  Stomach/Bowel:  No obstruction. No appendicitis.  Vascular/Lymphatic: No acute vascular abnormality. No mass or adenopathy.  Reproductive:No pathologic findings.  Right-sided dominant follicle.  Other: No ascites or pneumoperitoneum.  Musculoskeletal: No acute abnormalities.  IMPRESSION: 1. Obstructing 5 x 4 mm stone at the left UVJ. 2. Left nephrolithiasis.   Electronically Signed   By: Marnee Spring M.D.   On: 04/13/2018 06:07 I have independently reviewed the films.    Assessment & Plan:  ***  1. Left/Right ureteral stone***  - stone sent for analysis ***  2. Left/Right hydronephrosis***  - obtain RUS to ensure the  hydronephrosis has resolved once they have passed and/or recovered from procedure to ensure to iatrogenic hydronephrosis remains - it is explained to the patient that it is important to document resolution of the hydronephrosis as "silent hydronephrosis" can occur and cause damage and/or loss of the kidney  3. *** hematuria  - UA today demonstrates ***  - continue to monitor the patient's UA after the treatment/passage of the stone to ensure the hematuria has resolved  - if hematuria persists, we will pursue a hematuria workup with CT Urogram and cystoscopy if appropriate.    No follow-ups on file.  These notes generated with voice recognition software. I apologize for typographical errors.  Michiel Cowboy, PA-C  [facility] [address]  [address] [place], [place] [address] [phone]

## 2018-04-17 ENCOUNTER — Ambulatory Visit: Payer: Self-pay | Admitting: Urology

## 2018-04-17 ENCOUNTER — Other Ambulatory Visit: Payer: Self-pay

## 2018-04-17 DIAGNOSIS — N200 Calculus of kidney: Secondary | ICD-10-CM

## 2018-05-17 ENCOUNTER — Telehealth: Payer: Self-pay | Admitting: Urology

## 2018-05-17 ENCOUNTER — Ambulatory Visit: Payer: Self-pay | Admitting: Urology

## 2018-05-17 ENCOUNTER — Encounter: Payer: Self-pay | Admitting: Urology

## 2018-05-17 NOTE — Telephone Encounter (Signed)
We have left several messages with pt.  She hasn't had RUS or called back to find out about appt.  Please advise.

## 2018-05-17 NOTE — Telephone Encounter (Signed)
I also called her today and left a message.  I will send her a certified letter.

## 2018-06-02 ENCOUNTER — Encounter: Payer: Self-pay | Admitting: Urology

## 2019-06-12 ENCOUNTER — Emergency Department
Admission: EM | Admit: 2019-06-12 | Discharge: 2019-06-12 | Disposition: A | Discharge status: Home / Self Care | Payer: Medicaid Other | Attending: Student | Admitting: Student

## 2019-06-12 ENCOUNTER — Encounter: Payer: Self-pay | Admitting: Emergency Medicine

## 2019-06-12 ENCOUNTER — Other Ambulatory Visit: Payer: Self-pay

## 2019-06-12 DIAGNOSIS — N938 Other specified abnormal uterine and vaginal bleeding: Secondary | ICD-10-CM | POA: Insufficient documentation

## 2019-06-12 DIAGNOSIS — Z3202 Encounter for pregnancy test, result negative: Secondary | ICD-10-CM | POA: Insufficient documentation

## 2019-06-12 DIAGNOSIS — F17210 Nicotine dependence, cigarettes, uncomplicated: Secondary | ICD-10-CM | POA: Insufficient documentation

## 2019-06-12 DIAGNOSIS — Z3200 Encounter for pregnancy test, result unknown: Secondary | ICD-10-CM

## 2019-06-12 DIAGNOSIS — R1030 Lower abdominal pain, unspecified: Secondary | ICD-10-CM | POA: Insufficient documentation

## 2019-06-12 LAB — HCG, QUANTITATIVE, PREGNANCY: hCG, Beta Chain, Quant, S: <1 m[IU]/mL (ref <5)

## 2019-06-12 LAB — POCT PREGNANCY, URINE: Preg Test, Ur: NEGATIVE

## 2019-06-12 NOTE — ED Notes (Signed)
See triage note  Presents with vaginal bleeding  States the bleeding has been light   But now also having some cramping  States her bc has expired

## 2019-06-12 NOTE — ED Triage Notes (Signed)
First nurse note- here for vaginal bleeding. + pregnancy test last week

## 2019-06-12 NOTE — ED Provider Notes (Signed)
[facility] Emergency Department Provider Note  ____________________________________________   First MD Initiated Contact with Patient 06/12/19 1549     (approximate)  I have reviewed the triage vital signs and the nursing notes.  History  Chief Complaint Vaginal Bleeding and Possible Pregnancy    HPI Melissa Dominguez is a 29 y.o. female [phone] who presents to the ER for possible pregnancy. Patient states she has a Nexplanon in place, which expired 08/2018. Her menstrual cycle is usually very regular and heavy. On 5/23 she started bleeding, as expected (normal timing), however this cycle was different because it was extremely light.  She reports very mild, light pink spotting for the duration of her period.  Normally, her menstrual cycles are heavy from the start.  She says this most recent menstrual cycle never proceeded to become heavier like it normally does.  She feels very small amount of mild pressure in the lower abdomen/pelvis area, but besides that denies any discomfort.  No radiation.  No alleviating/aggravating components.  She denies any other associated vaginal discharge or itching.  No concern for STD exposure. Due to the difference in her period she took a home pregnancy test on Thursday which was positive. She present to the ER for evaluation of this. She is unsure as to how she feels about the positive home test so far.   Last Pap smear in 2017. Follows with [facility] as she is high risk.    Past Medical Hx Past Medical History:  Diagnosis Date  . Asthma   . Epilepsy [facility])     Problem List Patient Active Problem List   Diagnosis Date Noted  . Smoker 04/30/2015  . Generalized idiopathic epilepsy, not intractable, without status epilepticus ([place]) 04/18/2014  . Asthma without status asthmaticus 04/16/2013    Past Surgical Hx History reviewed. No pertinent surgical history.  Medications Prior to Admission medications   Not on File     Allergies Patient has no known allergies.  Family Hx No family history on file.  Social Hx Social History   Tobacco Use  . Smoking status: Current Every Day Smoker    Types: Cigarettes  . Smokeless tobacco: Never Used  Substance Use Topics  . Alcohol use: No  . Drug use: No     Review of Systems  Constitutional: Negative for fever. Negative for chills. Eyes: Negative for visual changes. ENT: Negative for sore throat. Cardiovascular: Negative for chest pain. Respiratory: Negative for shortness of breath. Gastrointestinal: Negative for nausea. Negative for vomiting.  Genitourinary: Negative for dysuria. + positive home pregnancy test Musculoskeletal: Negative for leg swelling. Skin: Negative for rash. Neurological: Negative for headaches.   Physical Exam  Vital Signs: ED Triage Vitals  Enc Vitals Group     BP 06/12/19 1513 121/69     Pulse Rate 06/12/19 1513 87     Resp 06/12/19 1513 18     Temp 06/12/19 1513 99 F (37.2 C)     Temp Source 06/12/19 1513 Oral     SpO2 06/12/19 1513 100 %     Weight --      Height --      Head Circumference --      Peak Flow --      Pain Score 06/12/19 1518 3     Pain Loc --      Pain Edu? --      Excl. in [place]? --     Constitutional: Alert and oriented. Well appearing. NAD.  Head: Normocephalic. Atraumatic. Eyes: Conjunctivae clear. Sclera anicteric. Pupils equal and symmetric. Nose: No masses or lesions. No congestion or rhinorrhea. Mouth/Throat: Wearing mask.  Neck: No stridor. Trachea midline.  Cardiovascular: Normal rate, regular rhythm. Extremities well perfused. Respiratory: Normal respiratory effort.  Lungs CTAB. Gastrointestinal: Soft. Non-distended. Non-tender.  Genitourinary: Offered pelvic exam + swabs to patient but she would like to defer in favor of follow up with her OBGYN in clinic. Given she has no other vaginal discharge or itching, no pelvic pain, and most recent Pap was in 2017, feel this is overall  reasonable. Musculoskeletal: No lower extremity edema. No deformities. Neurologic:  Normal speech and language. No gross focal or lateralizing neurologic deficits are appreciated.  Skin: Skin is warm, dry and intact. No rash noted. Psychiatric: Mood and affect are appropriate for situation.    Procedures  Procedure(s) performed (including critical care):  Procedures   Initial Impression / Assessment and Plan / MDM / ED Course  29 y.o. female who presents to the ED for light vaginal spotting and a positive home pregnancy test on Thursday.  Both her urine pregnancy test and serum beta hCG test here are negative for pregnancy.  Differential includes a false positive home pregnancy test, early chemical pregnancy, extremely early miscarriage.  Also consider pelvic infection for possible etiology of her light bleeding. Offered pelvic exam + swabs to patient but she would like to defer in favor of follow up with her OBGYN in clinic. Given she has no other vaginal discharge or itching, no STD exposure, no pelvic pain, and most recent Pap was in 2017, feel this is overall reasonable.  As such, feel patient is stable for discharge with outpatient follow-up.  She voices understanding and is in agreement and seems to be appropriately processing the results.  Given return precautions.  _______________________________   As part of my medical decision making I have reviewed available labs, radiology tests, reviewed old records/performed chart review.    Final Clinical Impression(s) / ED Diagnosis  Final diagnoses:  Possible pregnancy       Note:  This document was prepared using Dragon voice recognition software and may include unintentional dictation errors.   Miguel Aschoff., MD 06/12/19 [phone]

## 2019-06-12 NOTE — ED Triage Notes (Signed)
Pt reports has an expired BC inplant in her left arm. Pt states has been expired since last August. Pt reports her cycles have been regular until this month when she started with light spotting and it never got heave. Pt reports she took a pregnancy test at home and it was positive so she wanted to make sure all was ok.

## 2019-06-12 NOTE — Discharge Instructions (Signed)
Thank you for letting us take care of you in the emergency department today. Your urine and blood tests today were negative for pregnancy.   Please continue to take any regular, prescribed medications.   Please follow up with: Your OB/GYN doctor for general check up and follow up.  Please return to the ER for any new or worsening symptoms.

## 2021-10-15 ENCOUNTER — Ambulatory Visit (LOCAL_COMMUNITY_HEALTH_CENTER): Payer: Medicaid Other

## 2021-10-15 VITALS — BP 107/68 | Ht 63.0 in | Wt 115.5 lb

## 2021-10-15 DIAGNOSIS — Z3201 Encounter for pregnancy test, result positive: Secondary | ICD-10-CM

## 2021-10-15 LAB — PREGNANCY, URINE: Preg Test, Ur: POSITIVE — AB

## 2021-10-15 MED ORDER — PRENATAL VITAMINS 28-0.8 MG PO TABS: 28.0000 mg | ORAL_TABLET | Freq: Every day | ORAL | 0 refills | Status: AC

## 2021-10-15 NOTE — Progress Notes (Signed)
UPT positive.  States she will probably go to [facility] for prenatal care stating has hx of incompetent cervix, seizures, etc. and has been seen there for 2 of her pregnancies.  Pregnancy packet given and reviewed with pt.   The patient was dispensed prenatal vitamins  today. I provided counseling today regarding the medication.  Pt will check with DSS today about Medicaid.  Tonny Branch, RN
